# Patient Record
Sex: Female | Born: 1980 | Race: Black or African American | Hispanic: No | Marital: Single | Smoking: Never smoker
Health system: Southern US, Community
[De-identification: ages and names within clinical notes are randomized; demographics above are authoritative.]

## PROBLEM LIST (undated history)

## (undated) ENCOUNTER — Inpatient Hospital Stay (HOSPITAL_COMMUNITY): Payer: Self-pay

## (undated) DIAGNOSIS — R87629 Unspecified abnormal cytological findings in specimens from vagina: Secondary | ICD-10-CM

## (undated) DIAGNOSIS — E282 Polycystic ovarian syndrome: Secondary | ICD-10-CM

## (undated) DIAGNOSIS — K219 Gastro-esophageal reflux disease without esophagitis: Secondary | ICD-10-CM

## (undated) DIAGNOSIS — Z3490 Encounter for supervision of normal pregnancy, unspecified, unspecified trimester: Secondary | ICD-10-CM

## (undated) DIAGNOSIS — F909 Attention-deficit hyperactivity disorder, unspecified type: Secondary | ICD-10-CM

## (undated) HISTORY — PX: WISDOM TOOTH EXTRACTION: SHX21

---
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
## 2002-12-23 ENCOUNTER — Other Ambulatory Visit: Admission: RE | Admit: 2002-12-23 | Discharge: 2002-12-23 | Payer: Self-pay | Admitting: Gynecology

## 2003-10-28 ENCOUNTER — Other Ambulatory Visit: Admission: RE | Admit: 2003-10-28 | Discharge: 2003-10-28 | Payer: Self-pay | Admitting: Gynecology

## 2004-04-25 ENCOUNTER — Other Ambulatory Visit: Admission: RE | Admit: 2004-04-25 | Discharge: 2004-04-25 | Payer: Self-pay | Admitting: Gynecology

## 2006-01-18 ENCOUNTER — Other Ambulatory Visit: Admission: RE | Admit: 2006-01-18 | Discharge: 2006-01-18 | Payer: Self-pay | Admitting: Gynecology

## 2006-02-11 ENCOUNTER — Emergency Department (HOSPITAL_COMMUNITY): Admission: EM | Admit: 2006-02-11 | Discharge: 2006-02-11 | Payer: Self-pay | Admitting: Emergency Medicine

## 2006-02-15 ENCOUNTER — Emergency Department (HOSPITAL_COMMUNITY): Admission: EM | Admit: 2006-02-15 | Discharge: 2006-02-15 | Payer: Self-pay | Admitting: Family Medicine

## 2006-03-25 ENCOUNTER — Emergency Department (HOSPITAL_COMMUNITY): Admission: EM | Admit: 2006-03-25 | Discharge: 2006-03-25 | Payer: Self-pay | Admitting: Emergency Medicine

## 2006-03-26 ENCOUNTER — Emergency Department (HOSPITAL_COMMUNITY): Admission: EM | Admit: 2006-03-26 | Discharge: 2006-03-26 | Payer: Self-pay | Admitting: Family Medicine

## 2006-04-02 ENCOUNTER — Emergency Department (HOSPITAL_COMMUNITY): Admission: EM | Admit: 2006-04-02 | Discharge: 2006-04-02 | Payer: Self-pay | Admitting: Family Medicine

## 2006-06-24 ENCOUNTER — Emergency Department (HOSPITAL_COMMUNITY): Admission: EM | Admit: 2006-06-24 | Discharge: 2006-06-24 | Payer: Self-pay | Admitting: Emergency Medicine

## 2007-02-08 ENCOUNTER — Emergency Department (HOSPITAL_COMMUNITY): Admission: EM | Admit: 2007-02-08 | Discharge: 2007-02-08 | Payer: Self-pay | Admitting: Family Medicine

## 2007-09-24 ENCOUNTER — Inpatient Hospital Stay (HOSPITAL_COMMUNITY): Admission: AD | Admit: 2007-09-24 | Discharge: 2007-09-27 | Payer: Self-pay | Admitting: Obstetrics & Gynecology

## 2007-12-20 ENCOUNTER — Emergency Department (HOSPITAL_COMMUNITY): Admission: EM | Admit: 2007-12-20 | Discharge: 2007-12-20 | Payer: Self-pay | Admitting: Family Medicine

## 2007-12-21 ENCOUNTER — Emergency Department (HOSPITAL_COMMUNITY): Admission: EM | Admit: 2007-12-21 | Discharge: 2007-12-21 | Payer: Self-pay | Admitting: Emergency Medicine

## 2009-02-18 ENCOUNTER — Emergency Department (HOSPITAL_COMMUNITY): Admission: EM | Admit: 2009-02-18 | Discharge: 2009-02-18 | Payer: Self-pay | Admitting: Family Medicine

## 2010-07-01 NOTE — Discharge Summary (Signed)
NAMEDINESHA, Sierra Jackson              ACCOUNT NO.:  [id number]   MEDICAL RECORD NO.:  [id number]          PATIENT TYPE:  INP   LOCATION:  9148                          FACILITY:  [facility]   PHYSICIAN:  Malva Limes, M.D.    DATE OF BIRTH:  13-Feb-1981   DATE OF ADMISSION:  09/24/2007  DATE OF DISCHARGE:  09/27/2007                               DISCHARGE SUMMARY   FINAL DIAGNOSES:  1. Intrauterine pregnancy at term.  2. Active labor.  3. Spontaneous vaginal delivery of a female infant with Apgars of 7 and      8.   PROCEDURE:  Spontaneous vaginal delivery performed by Dr. Annamaria Helling.   COMPLICATIONS:  None.   This 30 year old G1, P0 presents at term in early labor.  The patient's  antepartum course up to this point had been complicated by history of  ADD, which the patient was not on any medications for during this  pregnancy.  The patient also did have a negative group B strep culture  obtained in our office at 36th week.  The patient dilated to complete  and completed, had spontaneous vaginal delivery of a 8 pounds 4 ounces  female infant with Apgars of 7 and 8 over a female of first-degree tear.  The delivery went without complications.  The patient's postpartum  course was benign without any significant fevers.  The patient did want  her little boy circumcised prior to discharge.  The patient was felt  ready for discharge.  On postpartum day #2, she was sent home on a  regular diet, told to decrease activities, told to continue her vitamins  and was given Percocet 1-2 every 4-6 hours as needed for pain.  I told  she could use over-the-counter Motrin up to 600 mg every 6 hours as  needed for pain and was to follow up in our office in 4 weeks.   INSTRUCTIONS:  Precautions were reviewed with the patient.   LABORATORIES ON DISCHARGE:  The patient had a hemoglobin of 13.1, white  blood cell count of 13.3, and platelets of 318,000.       Leilani Able, P.A.-C.      ______________________________  Malva Limes, M.D.    MB/MEDQ  D:  10/21/2007  T:  10/21/2007  Job:  [id number]

## 2010-07-17 ENCOUNTER — Inpatient Hospital Stay (HOSPITAL_COMMUNITY)
Admission: AD | Admit: 2010-07-17 | Discharge: 2010-07-20 | DRG: 775 | Disposition: A | Discharge status: Home / Self Care | Payer: Medicaid Other | Source: Ambulatory Visit | Attending: Obstetrics and Gynecology | Admitting: Obstetrics and Gynecology

## 2010-07-17 LAB — CBC
HCT: 37.5 % (ref 36.0–46.0)
MCH: 28.5 pg (ref 26.0–34.0)
MCHC: 34.4 g/dL (ref 30.0–36.0)
MCV: 83 fL (ref 78.0–100.0)
Platelets: 280 10*3/uL (ref 150–400)
RDW: 15.8 % — ABNORMAL HIGH (ref 11.5–15.5)

## 2010-07-19 LAB — CBC
MCH: 28.3 pg (ref 26.0–34.0)
MCHC: 32.9 g/dL (ref 30.0–36.0)
MCV: 86.2 fL (ref 78.0–100.0)
Platelets: 234 10*3/uL (ref 150–400)
RBC: 4.13 MIL/uL (ref 3.87–5.11)
RDW: 15.5 % (ref 11.5–15.5)

## 2010-11-11 LAB — CBC
HCT: 41
MCHC: 33.2
Platelets: 328
RBC: 4.38
RDW: 13.9
RDW: 14.8
WBC: 10.9 — ABNORMAL HIGH

## 2010-11-11 LAB — RPR: RPR Ser Ql: NONREACTIVE

## 2010-11-15 LAB — POCT RAPID STREP A: Streptococcus, Group A Screen (Direct): NEGATIVE

## 2010-11-18 LAB — POCT URINALYSIS DIP (DEVICE)
Glucose, UA: NEGATIVE
Hgb urine dipstick: NEGATIVE
Nitrite: NEGATIVE
Urobilinogen, UA: 0.2
pH: 5.5

## 2010-11-18 LAB — POCT PREGNANCY, URINE
Operator id: 239701
Preg Test, Ur: POSITIVE

## 2012-09-12 ENCOUNTER — Emergency Department (HOSPITAL_COMMUNITY)
Admission: EM | Admit: 2012-09-12 | Discharge: 2012-09-12 | Disposition: A | Discharge status: Home / Self Care | Payer: Medicaid Other | Attending: Emergency Medicine | Admitting: Emergency Medicine

## 2012-09-12 ENCOUNTER — Encounter (HOSPITAL_COMMUNITY): Payer: Self-pay | Admitting: Adult Health

## 2012-09-12 ENCOUNTER — Emergency Department (HOSPITAL_COMMUNITY): Payer: Medicaid Other

## 2012-09-12 DIAGNOSIS — R110 Nausea: Secondary | ICD-10-CM | POA: Insufficient documentation

## 2012-09-12 DIAGNOSIS — R51 Headache: Secondary | ICD-10-CM | POA: Insufficient documentation

## 2012-09-12 DIAGNOSIS — F909 Attention-deficit hyperactivity disorder, unspecified type: Secondary | ICD-10-CM | POA: Insufficient documentation

## 2012-09-12 DIAGNOSIS — R059 Cough, unspecified: Secondary | ICD-10-CM | POA: Insufficient documentation

## 2012-09-12 DIAGNOSIS — L539 Erythematous condition, unspecified: Secondary | ICD-10-CM | POA: Insufficient documentation

## 2012-09-12 DIAGNOSIS — R05 Cough: Secondary | ICD-10-CM | POA: Insufficient documentation

## 2012-09-12 DIAGNOSIS — J020 Streptococcal pharyngitis: Secondary | ICD-10-CM | POA: Insufficient documentation

## 2012-09-12 DIAGNOSIS — Z79899 Other long term (current) drug therapy: Secondary | ICD-10-CM | POA: Insufficient documentation

## 2012-09-12 DIAGNOSIS — Z8742 Personal history of other diseases of the female genital tract: Secondary | ICD-10-CM | POA: Insufficient documentation

## 2012-09-12 HISTORY — DX: Polycystic ovarian syndrome: E28.2

## 2012-09-12 HISTORY — DX: Attention-deficit hyperactivity disorder, unspecified type: F90.9

## 2012-09-12 LAB — BASIC METABOLIC PANEL WITH GFR
BUN: 8 mg/dL (ref 6–23)
CO2: 26 meq/L (ref 19–32)
Chloride: 100 meq/L (ref 96–112)
Creatinine, Ser: 1.11 mg/dL — ABNORMAL HIGH (ref 0.50–1.10)
Glucose, Bld: 100 mg/dL — ABNORMAL HIGH (ref 70–99)
Potassium: 3.6 meq/L (ref 3.5–5.1)

## 2012-09-12 LAB — BASIC METABOLIC PANEL
Calcium: 9.4 mg/dL (ref 8.4–10.5)
GFR calc Af Amer: 77 mL/min — ABNORMAL LOW (ref >90)
GFR calc non Af Amer: 66 mL/min — ABNORMAL LOW (ref >90)
Sodium: 137 mEq/L (ref 135–145)

## 2012-09-12 LAB — CBC WITH DIFFERENTIAL/PLATELET
Basophils Absolute: 0 10*3/uL (ref 0.0–0.1)
Basophils Relative: 0 % (ref 0–1)
Eosinophils Absolute: 0 10*3/uL (ref 0.0–0.7)
Eosinophils Relative: 0 % (ref 0–5)
HCT: 40.3 % (ref 36.0–46.0)
Hemoglobin: 14.2 g/dL (ref 12.0–15.0)
Lymphocytes Relative: 18 % (ref 12–46)
Lymphs Abs: 2 K/uL (ref 0.7–4.0)
MCH: 29.2 pg (ref 26.0–34.0)
MCHC: 35.2 g/dL (ref 30.0–36.0)
MCV: 82.8 fL (ref 78.0–100.0)
Monocytes Absolute: 0.8 K/uL (ref 0.1–1.0)
Monocytes Relative: 8 % (ref 3–12)
Neutro Abs: 8.1 K/uL — ABNORMAL HIGH (ref 1.7–7.7)
Neutrophils Relative %: 74 % (ref 43–77)
Platelets: 329 10*3/uL (ref 150–400)
RBC: 4.87 MIL/uL (ref 3.87–5.11)
RDW: 14 % (ref 11.5–15.5)
WBC: 10.9 K/uL — ABNORMAL HIGH (ref 4.0–10.5)

## 2012-09-12 LAB — RAPID STREP SCREEN (MED CTR MEBANE ONLY): Streptococcus, Group A Screen (Direct): NEGATIVE

## 2012-09-12 MED ORDER — ONDANSETRON 4 MG PO TBDP: 4.0000 mg | ORAL_TABLET | Freq: Three times a day (TID) | ORAL | Status: DC | PRN

## 2012-09-12 MED ORDER — ACETAMINOPHEN 325 MG PO TABS
650.0000 mg | ORAL_TABLET | Freq: Once | ORAL | Status: AC
  Administered 2012-09-12: 650 mg via ORAL
  Filled 2012-09-12: qty 2

## 2012-09-12 MED ORDER — PENICILLIN G BENZATHINE 1200000 UNIT/2ML IM SUSP
1.2000 10*6.[IU] | Freq: Once | INTRAMUSCULAR | Status: AC
  Administered 2012-09-12: 1.2 10*6.[IU] via INTRAMUSCULAR
  Filled 2012-09-12: qty 2

## 2012-09-12 MED ORDER — DEXAMETHASONE SODIUM PHOSPHATE 10 MG/ML IJ SOLN
10.0000 mg | Freq: Once | INTRAMUSCULAR | Status: AC
  Administered 2012-09-12: 10 mg via INTRAMUSCULAR
  Filled 2012-09-12: qty 1

## 2012-09-12 NOTE — ED Provider Notes (Signed)
CSN: [id number]     Arrival date & time 09/12/12  1756 History     First MD Initiated Contact with Patient 09/12/12 2008     Chief Complaint  Patient presents with  . Fever   (Consider location/radiation/quality/duration/timing/severity/associated sxs/prior Treatment) The history is provided by the patient, medical records and a parent.   Patient presents to the ED for fever, sore throat, headache, and intermittent nausea x 4 days. States pain is worse with swallowing but no difficulty doing so.  States "it feels like there are ulcers on my tonsils and it is making me nauseated."  Decreased PO intake due to painful swallowing.  Patient states she's also had an intermittent, non-productive cough, and mild pain with deep inspiration. No chest pain or SOB.  No recent known sick contacts. No hx of asthma.  Patient was seen in urgent care earlier today and had a rapid strep and mono tests performed, both of which were negative.  Past Medical History  Diagnosis Date  . PCOS (polycystic ovarian syndrome)   . ADHD (attention deficit hyperactivity disorder)    History reviewed. No pertinent past surgical history. History reviewed. No pertinent family history. History  Substance Use Topics  . Smoking status: Never Smoker   . Smokeless tobacco: Not on file  . Alcohol Use: No   OB History   Grav Para Term Preterm Abortions TAB SAB Ect Mult Living                 Review of Systems  Constitutional: Positive for fever.  HENT: Positive for sore throat.   Respiratory: Positive for cough.   Gastrointestinal: Positive for nausea.  Neurological: Positive for headaches.  All other systems reviewed and are negative.    Allergies  Review of patient's allergies indicates no known allergies.  Home Medications   Current Outpatient Rx  Name  Route  Sig  Dispense  Refill  . acetaminophen (TYLENOL) 500 MG tablet   Oral   Take 1,000 mg by mouth every 6 (six) hours as needed for pain.          Marland Kitchen amoxicillin-clavulanate (AUGMENTIN) 875-125 MG per tablet   Oral   Take 1 tablet by mouth 2 (two) times daily. For 14 days         . amphetamine-dextroamphetamine (ADDERALL) 20 MG tablet   Oral   Take 40 mg by mouth daily.         . norethindrone (MICRONOR,CAMILA,ERRIN) 0.35 MG tablet   Oral   Take 1 tablet by mouth daily.          BP 104/64  Pulse 104  Temp(Src) 102.8 F (39.3 C) (Oral)  Resp 16  Wt 222 lb (100.699 kg)  SpO2 100%  LMP 08/22/2012  Physical Exam  Nursing note and vitals reviewed. Constitutional: She is oriented to person, place, and time. She appears well-developed and well-nourished. No distress.  HENT:  Head: Normocephalic and atraumatic. No trismus in the jaw.  Right Ear: Tympanic membrane and ear canal normal.  Left Ear: Tympanic membrane and ear canal normal.  Nose: Nose normal.  Mouth/Throat: Uvula is midline and mucous membranes are normal. No oral lesions. No edematous. Posterior oropharyngeal erythema present. No oropharyngeal exudate, posterior oropharyngeal edema or tonsillar abscesses.  Oropharynx erythematous, tonsils 2+ bilaterally with exudates, no oropharyngeal edema, airway patent, uvula midline, no PTA, handling secretions appropriately, no difficulty swallowing  Eyes: Conjunctivae and EOM are normal. Pupils are equal, round, and reactive to light.  Neck: Normal  range of motion. Neck supple.  Cardiovascular: Normal rate, regular rhythm and normal heart sounds.   Pulmonary/Chest: Effort normal and breath sounds normal. Not tachypneic. No respiratory distress. She has no decreased breath sounds. She has no wheezes. She has no rhonchi.  Lungs CTAB  Abdominal: Soft. Bowel sounds are normal. There is no tenderness. There is no guarding.  Musculoskeletal: Normal range of motion.  Neurological: She is alert and oriented to person, place, and time.  Skin: Skin is warm and dry. She is not diaphoretic.  Psychiatric: She has a normal mood and  affect.    ED Course   Procedures (including critical care time)  Labs Reviewed  CBC WITH DIFFERENTIAL - Abnormal; Notable for the following:    WBC 10.9 (*)    Neutro Abs 8.1 (*)    All other components within normal limits  BASIC METABOLIC PANEL - Abnormal; Notable for the following:    Glucose, Bld 100 (*)    Creatinine, Ser 1.11 (*)    GFR calc non Af Amer 66 (*)    GFR calc Af Amer 77 (*)    All other components within normal limits  RAPID STREP SCREEN  CULTURE, GROUP A STREP   Dg Chest 2 View  09/12/2012   *RADIOLOGY REPORT*  Clinical Data: Fever, left-sided chest pain  CHEST - 2 VIEW  Comparison: April 02, 2006  Findings: There is no focal infiltrate, pulmonary edema, or pleural effusion.  The mediastinal contour and cardiac silhouette are normal.  The soft tissues and osseous structures are normal.  IMPRESSION: No acute cardiopulmonary disease identified.   Original Report Authenticated By: Sherian Rein, M.D.   1. Strep pharyngitis     MDM   Labs as above.  CXR clear.  Rapid strep negative, however Signs/sx consistent with strep.  Tx with decadron and bicillin in the ED.  Tylenol given for fever.  Pt tolerating PO without difficulty, airway patent, pt states she feels better after meds given.  Rx zofran for intermittent nausea.  FU with [facility] if sx not improving in the next few days.  Discussed plan with pt and mom, they agreed.  Return precautions advised.  Garlon Hatchet, PA-C 09/12/12 2258

## 2012-09-12 NOTE — ED Notes (Signed)
Presents with fever of 100.8, chills, sore throat, nausea, headache since Monday. She reports that she had a mono and strep test at an [facility] and it came back negative. Pt staes she is feeling worse and not better. Throat red and tonsils enlarged.  Reports cough and pain with deep inspiration.

## 2012-09-13 NOTE — ED Provider Notes (Signed)
Medical screening examination/treatment/procedure(s) were performed by non-physician practitioner and as supervising physician I was immediately available for consultation/collaboration.   Pressley Tadesse Y. Torra Pala, MD 09/13/12 1454 

## 2012-09-14 LAB — CULTURE, GROUP A STREP

## 2012-11-12 DIAGNOSIS — Z862 Personal history of diseases of the blood and blood-forming organs and certain disorders involving the immune mechanism: Secondary | ICD-10-CM | POA: Insufficient documentation

## 2012-11-12 DIAGNOSIS — Z79899 Other long term (current) drug therapy: Secondary | ICD-10-CM | POA: Insufficient documentation

## 2012-11-12 DIAGNOSIS — K089 Disorder of teeth and supporting structures, unspecified: Secondary | ICD-10-CM | POA: Insufficient documentation

## 2012-11-12 DIAGNOSIS — F909 Attention-deficit hyperactivity disorder, unspecified type: Secondary | ICD-10-CM | POA: Insufficient documentation

## 2012-11-12 DIAGNOSIS — Z8639 Personal history of other endocrine, nutritional and metabolic disease: Secondary | ICD-10-CM | POA: Insufficient documentation

## 2012-11-12 DIAGNOSIS — H9209 Otalgia, unspecified ear: Secondary | ICD-10-CM | POA: Insufficient documentation

## 2012-11-12 NOTE — ED Notes (Signed)
Presents with left sided dental pain with radiation into left ear, pain began Friday., no dental caries noted.

## 2012-11-13 ENCOUNTER — Emergency Department (HOSPITAL_COMMUNITY)
Admission: EM | Admit: 2012-11-13 | Discharge: 2012-11-13 | Disposition: A | Discharge status: Home / Self Care | Payer: Medicaid Other | Attending: Emergency Medicine | Admitting: Emergency Medicine

## 2012-11-13 ENCOUNTER — Encounter (HOSPITAL_COMMUNITY): Payer: Self-pay | Admitting: Adult Health

## 2012-11-13 DIAGNOSIS — K0889 Other specified disorders of teeth and supporting structures: Secondary | ICD-10-CM

## 2012-11-13 MED ORDER — PENICILLIN V POTASSIUM 250 MG PO TABS
500.0000 mg | ORAL_TABLET | Freq: Once | ORAL | Status: AC
  Administered 2012-11-13: 500 mg via ORAL
  Filled 2012-11-13: qty 2

## 2012-11-13 MED ORDER — TRAMADOL HCL 50 MG PO TABS
50.0000 mg | ORAL_TABLET | Freq: Once | ORAL | Status: AC
  Administered 2012-11-13: 50 mg via ORAL
  Filled 2012-11-13: qty 1

## 2012-11-13 MED ORDER — TRAMADOL HCL 50 MG PO TABS: 50.0000 mg | ORAL_TABLET | Freq: Four times a day (QID) | ORAL | Status: DC | PRN

## 2012-11-13 MED ORDER — PENICILLIN V POTASSIUM 500 MG PO TABS: 500.0000 mg | ORAL_TABLET | Freq: Three times a day (TID) | ORAL | Status: DC

## 2012-11-13 NOTE — ED Provider Notes (Signed)
Medical screening examination/treatment/procedure(s) were performed by non-physician practitioner and as supervising physician I was immediately available for consultation/collaboration.  Flint Melter, MD 11/13/12 1039

## 2012-11-13 NOTE — ED Provider Notes (Signed)
CSN: [id number]     Arrival date & time 11/12/12  2358 History   First MD Initiated Contact with Patient 11/13/12 0136     Chief Complaint  Patient presents with  . Dental Pain   (Consider location/radiation/quality/duration/timing/severity/associated sxs/prior Treatment) HPI Comments: Patient with pain in the very back, right upper, and right lower for several, days .  Has been taking ibuprofen without relief  Patient is a 32 y.o. female presenting with tooth pain. The history is provided by the patient.  Dental Pain Location:  Upper and lower Upper teeth location:  1/RU 3rd molar Lower teeth location:  17/LL 3rd molar Quality:  Aching Severity:  Moderate Timing:  Constant Progression:  Worsening Chronicity:  New Associated symptoms: no fever and no headaches     Past Medical History  Diagnosis Date  . PCOS (polycystic ovarian syndrome)   . ADHD (attention deficit hyperactivity disorder)    History reviewed. No pertinent past surgical history. History reviewed. No pertinent family history. History  Substance Use Topics  . Smoking status: Never Smoker   . Smokeless tobacco: Not on file  . Alcohol Use: No   OB History   Grav Para Term Preterm Abortions TAB SAB Ect Mult Living                 Review of Systems  Constitutional: Negative for fever and chills.  HENT: Positive for ear pain and dental problem.   Neurological: Negative for dizziness and headaches.  All other systems reviewed and are negative.    Allergies  Review of patient's allergies indicates no known allergies.  Home Medications   Current Outpatient Rx  Name  Route  Sig  Dispense  Refill  . amphetamine-dextroamphetamine (ADDERALL) 20 MG tablet   Oral   Take 40 mg by mouth daily.         . norethindrone (MICRONOR,CAMILA,ERRIN) 0.35 MG tablet   Oral   Take 1 tablet by mouth daily.         . penicillin v potassium (VEETID) 500 MG tablet   Oral   Take 1 tablet (500 mg total) by mouth 3  (three) times daily.   30 tablet   0   . traMADol (ULTRAM) 50 MG tablet   Oral   Take 1 tablet (50 mg total) by mouth every 6 (six) hours as needed for pain.   30 tablet   0    BP 131/89  Pulse 102  Temp(Src) 98 F (36.7 C) (Oral)  Resp 18  SpO2 100% Physical Exam  Nursing note and vitals reviewed. Constitutional: She is oriented to person, place, and time. She appears well-developed and well-nourished.  HENT:  Head: Normocephalic.  Right Ear: External ear normal.  Left Ear: External ear normal.  Mouth/Throat:    Eyes: Pupils are equal, round, and reactive to light.  Neck: Normal range of motion.  Cardiovascular: Normal rate.   Pulmonary/Chest: Effort normal.  Lymphadenopathy:    She has no cervical adenopathy.  Neurological: She is alert and oriented to person, place, and time.  Skin: Skin is warm. No rash noted. No erythema.    ED Course  Procedures (including critical care time) Labs Review Labs Reviewed - No data to display Imaging Review No results found.  MDM   1. Pain, dental     Will refer patient to Dr. Barbette Merino oral surgery    Arman Filter, NP 11/13/12 4098

## 2012-11-13 NOTE — ED Notes (Signed)
PA has given ok to administer Pain med at discharge

## 2013-12-17 ENCOUNTER — Emergency Department (HOSPITAL_COMMUNITY)
Admission: EM | Admit: 2013-12-17 | Discharge: 2013-12-17 | Disposition: A | Discharge status: Home / Self Care | Payer: BC Managed Care – PPO | Source: Home / Self Care | Attending: Emergency Medicine | Admitting: Emergency Medicine

## 2013-12-17 ENCOUNTER — Encounter (HOSPITAL_COMMUNITY): Payer: Self-pay | Admitting: Emergency Medicine

## 2013-12-17 DIAGNOSIS — K088 Other specified disorders of teeth and supporting structures: Secondary | ICD-10-CM

## 2013-12-17 DIAGNOSIS — K0889 Other specified disorders of teeth and supporting structures: Secondary | ICD-10-CM

## 2013-12-17 MED ORDER — CEFTRIAXONE SODIUM 250 MG IJ SOLR
INTRAMUSCULAR | Status: AC
  Filled 2013-12-17: qty 250

## 2013-12-17 MED ORDER — PENICILLIN V POTASSIUM 500 MG PO TABS: 500.0000 mg | ORAL_TABLET | Freq: Four times a day (QID) | ORAL | Status: AC

## 2013-12-17 MED ORDER — KETOROLAC TROMETHAMINE 60 MG/2ML IM SOLN
60.0000 mg | Freq: Once | INTRAMUSCULAR | Status: AC
  Administered 2013-12-17: 60 mg via INTRAMUSCULAR

## 2013-12-17 MED ORDER — FLUCONAZOLE 150 MG PO TABS: 150.0000 mg | ORAL_TABLET | Freq: Every day | ORAL | Status: DC

## 2013-12-17 MED ORDER — HYDROCODONE-ACETAMINOPHEN 5-325 MG PO TABS: 1.0000 | ORAL_TABLET | Freq: Four times a day (QID) | ORAL | Status: DC | PRN

## 2013-12-17 MED ORDER — LIDOCAINE HCL (PF) 1 % IJ SOLN
INTRAMUSCULAR | Status: AC
  Filled 2013-12-17: qty 5

## 2013-12-17 MED ORDER — KETOROLAC TROMETHAMINE 60 MG/2ML IM SOLN
INTRAMUSCULAR | Status: AC
  Filled 2013-12-17: qty 2

## 2013-12-17 MED ORDER — CEFTRIAXONE SODIUM 250 MG IJ SOLR
250.0000 mg | Freq: Once | INTRAMUSCULAR | Status: AC
  Administered 2013-12-17: 250 mg via INTRAMUSCULAR

## 2013-12-17 NOTE — ED Provider Notes (Signed)
CSN: [id number]     Arrival date & time 12/17/13  1859 History   First MD Initiated Contact with Patient 12/17/13 1930     Chief Complaint  Patient presents with  . Dental Pain   (Consider location/radiation/quality/duration/timing/severity/associated sxs/prior Treatment) HPI  She is a 33 year old woman here for evaluation of dental pain. She states that she had sudden onset of pain in her left jaw. It involves both the upper and lower jaw, it goes up to her ear and into the left occiput. It is a little worse with opening and closing her jaw. She denies any acute injury or trauma. She does state she has a chipped tooth. No fevers or chills. No nausea or vomiting.  She does report a history of a dental infection that led to sepsis. She is quite anxious about this as this pain is similar.  Past Medical History  Diagnosis Date  . PCOS (polycystic ovarian syndrome)   . ADHD (attention deficit hyperactivity disorder)    History reviewed. No pertinent past surgical history. No family history on file. History  Substance Use Topics  . Smoking status: Never Smoker   . Smokeless tobacco: Not on file  . Alcohol Use: No   OB History    No data available     Review of Systems  Constitutional: Negative for fever and chills.  HENT: Positive for dental problem. Negative for drooling.   Gastrointestinal: Negative for nausea and vomiting.  Neurological: Negative for headaches.    Allergies  Review of patient's allergies indicates no known allergies.  Home Medications   Prior to Admission medications   Medication Sig Start Date End Date Taking? Authorizing Provider  amphetamine-dextroamphetamine (ADDERALL) 20 MG tablet Take 40 mg by mouth daily.   Yes Historical Provider, MD  HYDROcodone-acetaminophen (NORCO) 5-325 MG per tablet Take 1 tablet by mouth every 6 (six) hours as needed for moderate pain. 12/17/13   Charm RingsErin J Honig, MD  norethindrone (MICRONOR,CAMILA,ERRIN) 0.35 MG tablet Take 1  tablet by mouth daily.    Historical Provider, MD  penicillin v potassium (VEETID) 500 MG tablet Take 1 tablet (500 mg total) by mouth 4 (four) times daily. 12/17/13 12/24/13  Charm RingsErin J Honig, MD  traMADol (ULTRAM) 50 MG tablet Take 1 tablet (50 mg total) by mouth every 6 (six) hours as needed for pain. 11/13/12   Arman FilterGail K Schulz, NP   BP 121/78 mmHg  Pulse 89  Temp(Src) 97.8 F (36.6 C) (Oral)  Resp 16  SpO2 100%  LMP 11/05/2013 Physical Exam  Constitutional: She is oriented to person, place, and time. She appears well-developed and well-nourished. She appears distressed.  HENT:  Head: Normocephalic and atraumatic.  Mouth/Throat: Mucous membranes are not dry. Abnormal dentition. No dental abscesses.    No obvious dental abscess, but diffusely tender on the left upper and lower jaw.  Minimal tenderness over TMJ joint.  Neck: Neck supple.  Cardiovascular: Normal rate.   Pulmonary/Chest: Effort normal.  Lymphadenopathy:    She has no cervical adenopathy.  Neurological: She is alert and oriented to person, place, and time.    ED Course  Procedures (including critical care time) Labs Review Labs Reviewed - No data to display  Imaging Review No results found.   MDM   1. Pain, dental    Toradol 60 mg IM and Rocephin 250 mg IM given here.  We'll treat with a 10 day course of penicillin. Recommended ibuprofen for pain. Prescription for Norco provided for severe pain.  Reviewed  warning precautions as in after visit summary. Discussed that she'll need follow-up with a dentist.    Charm RingsErin J Honig, MD 12/17/13 2001

## 2013-12-17 NOTE — ED Notes (Signed)
Reports she developed severe dental pain onset 1750 Denies fevers, chills Alert, no signs of acute distress.

## 2013-12-17 NOTE — Discharge Instructions (Signed)
Take penicillin 1 pill 4 times a day starting tomorrow. Take ibuprofen 600mg  every 8 hours as needed for pain. Take Norco 1-2 tablets every 4-6 hours as needed for severe pain.  If your symptoms worse, you develop fevers or vomiting, or you are not improving by Friday, please go to the emergency room.

## 2014-09-03 ENCOUNTER — Emergency Department (INDEPENDENT_AMBULATORY_CARE_PROVIDER_SITE_OTHER): Payer: Self-pay

## 2014-09-03 ENCOUNTER — Emergency Department (INDEPENDENT_AMBULATORY_CARE_PROVIDER_SITE_OTHER)
Admission: EM | Admit: 2014-09-03 | Discharge: 2014-09-03 | Disposition: A | Discharge status: Home / Self Care | Payer: Self-pay | Source: Home / Self Care | Attending: Family Medicine | Admitting: Family Medicine

## 2014-09-03 ENCOUNTER — Encounter (HOSPITAL_COMMUNITY): Payer: Self-pay | Admitting: Emergency Medicine

## 2014-09-03 MED ORDER — CYCLOBENZAPRINE HCL 5 MG PO TABS: 5.0000 mg | ORAL_TABLET | Freq: Three times a day (TID) | ORAL | Status: DC | PRN

## 2014-09-03 MED ORDER — IBUPROFEN 800 MG PO TABS: 800.0000 mg | ORAL_TABLET | Freq: Three times a day (TID) | ORAL | Status: DC
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
## 2014-09-03 NOTE — Discharge Instructions (Signed)
Heat and medicine as needed, see your doctor if further problems. °

## 2014-11-13 LAB — OB RESULTS CONSOLE GC/CHLAMYDIA
CHLAMYDIA, DNA PROBE: NEGATIVE
GC PROBE AMP, GENITAL: NEGATIVE

## 2014-12-10 LAB — OB RESULTS CONSOLE HIV ANTIBODY (ROUTINE TESTING): HIV: NONREACTIVE

## 2014-12-10 LAB — OB RESULTS CONSOLE RPR: RPR: NONREACTIVE

## 2014-12-10 LAB — OB RESULTS CONSOLE ABO/RH: RH Type: POSITIVE

## 2014-12-10 LAB — OB RESULTS CONSOLE HEPATITIS B SURFACE ANTIGEN: Hepatitis B Surface Ag: NEGATIVE

## 2014-12-10 LAB — OB RESULTS CONSOLE RUBELLA ANTIBODY, IGM: RUBELLA: IMMUNE
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
## 2015-05-08 ENCOUNTER — Inpatient Hospital Stay (HOSPITAL_COMMUNITY)
Admission: AD | Admit: 2015-05-08 | Discharge: 2015-05-08 | Disposition: A | Discharge status: Home / Self Care | Payer: Medicaid Other | Source: Ambulatory Visit | Attending: Obstetrics | Admitting: Obstetrics

## 2015-05-08 ENCOUNTER — Encounter (HOSPITAL_COMMUNITY): Payer: Self-pay | Admitting: *Deleted

## 2015-05-08 DIAGNOSIS — O4693 Antepartum hemorrhage, unspecified, third trimester: Secondary | ICD-10-CM | POA: Insufficient documentation

## 2015-05-08 DIAGNOSIS — Z3A32 32 weeks gestation of pregnancy: Secondary | ICD-10-CM | POA: Diagnosis not present

## 2015-05-08 DIAGNOSIS — R319 Hematuria, unspecified: Secondary | ICD-10-CM | POA: Insufficient documentation

## 2015-05-08 LAB — URINALYSIS, ROUTINE W REFLEX MICROSCOPIC
Bilirubin Urine: NEGATIVE
Glucose, UA: NEGATIVE mg/dL
KETONES UR: NEGATIVE mg/dL
Nitrite: NEGATIVE
Protein, ur: 30 mg/dL — AB
Specific Gravity, Urine: 1.025 (ref 1.005–1.030)
pH: 6.5 (ref 5.0–8.0)

## 2015-05-08 LAB — URINE MICROSCOPIC-ADD ON

## 2015-05-08 NOTE — MAU Note (Signed)
Pt C/O bleeding that started this a.m.,  Like the beginning of a period with some clots.  Denies pain.  Denies placenta previa.

## 2015-05-08 NOTE — Discharge Instructions (Signed)
Drink at least 8 8-oz glasses of water every day. The urine culture is pending.

## 2015-05-08 NOTE — MAU Provider Note (Signed)
History     CSN: [id number]  Arrival date and time: 05/08/15 0801   First Provider Initiated Contact with Patient 05/08/15 [phone]      Chief Complaint  Patient presents with  . Vaginal Bleeding   HPI Ramsha A Gombos 35 y.o. 7239w6d Comes to MAU today as she saw bleeding when she wiped today and had blood in the toilet.  No bleeding on clothing when coming to the hospital.  No abdominal pain.  Baby is moving well.  No dysuria.  OB History    Gravida Para Term Preterm AB TAB SAB Ectopic Multiple Living   3 2 2       2       Past Medical History  Diagnosis Date  . PCOS (polycystic ovarian syndrome)   . ADHD (attention deficit hyperactivity disorder)     History reviewed. No pertinent past surgical history.  History reviewed. No pertinent family history.  Social History  Substance Use Topics  . Smoking status: Never Smoker   . Smokeless tobacco: None  . Alcohol Use: No    Allergies: No Known Allergies  Prescriptions prior to admission  Medication Sig Dispense Refill Last Dose  . Prenatal Vit-Fe Fumarate-FA (PRENATAL MULTIVITAMIN) TABS tablet Take 1 tablet by mouth daily at 12 noon.   05/07/2015 at Unknown time  . cyclobenzaprine (FLEXERIL) 5 MG tablet Take 1 tablet (5 mg total) by mouth 3 (three) times daily as needed for muscle spasms. (Patient not taking: Reported on 05/08/2015) 30 tablet 0   . fluconazole (DIFLUCAN) 150 MG tablet Take 1 tablet (150 mg total) by mouth daily. (Patient not taking: Reported on 05/08/2015) 1 tablet 0 Unknown at Unknown time  . HYDROcodone-acetaminophen (NORCO) 5-325 MG per tablet Take 1 tablet by mouth every 6 (six) hours as needed for moderate pain. (Patient not taking: Reported on 05/08/2015) 20 tablet 0 Unknown at Unknown time  . ibuprofen (ADVIL,MOTRIN) 800 MG tablet Take 1 tablet (800 mg total) by mouth 3 (three) times daily. (Patient not taking: Reported on 05/08/2015) 30 tablet 0   . traMADol (ULTRAM) 50 MG tablet Take 1 tablet (50 mg total)  by mouth every 6 (six) hours as needed for pain. (Patient not taking: Reported on 05/08/2015) 30 tablet 0 Unknown at Unknown time    Review of Systems  Constitutional: Negative for fever.  Gastrointestinal: Negative for nausea, vomiting, abdominal pain, diarrhea and constipation.  Genitourinary:       No vaginal discharge. Bleeding when wiping. No dysuria.    Physical Exam   Blood pressure 130/66, pulse 94, temperature 97.4 F (36.3 C), temperature source Oral, resp. rate 18, last menstrual period 08/13/2014.  Physical Exam  Nursing note and vitals reviewed. Constitutional: She is oriented to person, place, and time. She appears well-developed and well-nourished.  HENT:  Head: Normocephalic.  Eyes: EOM are normal.  Neck: Neck supple.  GI: Soft. There is no tenderness.  FHT shows 15x15 accels.  Reactive strip.  Baby moving well.  No contractions on the monitor strip and none felt by patient.  Genitourinary:  Speculum exam: Vagina - Small amount of creamy discharge, no odor Cervix - No contact bleeding, no blood seen in the vagina. Bimanual exam: Cervix closed Chaperone present for exam.  Musculoskeletal: Normal range of motion.  Neurological: She is alert and oriented to person, place, and time.  Skin: Skin is warm and dry.  Psychiatric: She has a normal mood and affect.    MAU Course  Procedures Results for orders  placed or performed during the hospital encounter of 05/08/15 (from the past 24 hour(s))  Urinalysis, Routine w reflex microscopic (not at [facility]     Status: Abnormal   Collection Time: 05/08/15  8:15 AM  Result Value Ref Range   Color, Urine YELLOW YELLOW   APPearance CLEAR CLEAR   Specific Gravity, Urine 1.025 1.005 - 1.030   pH 6.5 5.0 - 8.0   Glucose, UA NEGATIVE NEGATIVE mg/dL   Hgb urine dipstick LARGE (A) NEGATIVE   Bilirubin Urine NEGATIVE NEGATIVE   Ketones, ur NEGATIVE NEGATIVE mg/dL   Protein, ur 30 (A) NEGATIVE mg/dL   Nitrite NEGATIVE  NEGATIVE   Leukocytes, UA MODERATE (A) NEGATIVE  Urine microscopic-add on     Status: Abnormal   Collection Time: 05/08/15  8:15 AM  Result Value Ref Range   Squamous Epithelial / LPF 6-30 (A) NONE SEEN   WBC, UA 6-30 0 - 5 WBC/hpf   RBC / HPF 0-5 0 - 5 RBC/hpf   Bacteria, UA MANY (A) NONE SEEN    MDM Consult with Dr. Chestine Spore by phone.  Will do urine culture.  Advise patient to be alert for symptoms of kidney stone due to blood in urine.  Assessment and Plan  No evidence of placental bleeding and all clinical indications show baby is doing well. 32w 6d and stable  Plan Keep appointment in the office. Call your doctor if you have worsening symptoms. Drink at least 8 8-oz glasses of water every day. Urine culture pending.  Luevenia Mcavoy 05/08/2015, 9:18 AM

## 2015-05-26 LAB — OB RESULTS CONSOLE GBS: GBS: NEGATIVE

## 2015-06-29 ENCOUNTER — Inpatient Hospital Stay (HOSPITAL_COMMUNITY): Payer: Medicaid Other | Admitting: Anesthesiology

## 2015-06-29 ENCOUNTER — Inpatient Hospital Stay (HOSPITAL_COMMUNITY)
Admission: AD | Admit: 2015-06-29 | Discharge: 2015-07-01 | DRG: 775 | Disposition: A | Discharge status: Home / Self Care | Payer: Medicaid Other | Source: Ambulatory Visit | Attending: Obstetrics and Gynecology | Admitting: Obstetrics and Gynecology

## 2015-06-29 ENCOUNTER — Encounter (HOSPITAL_COMMUNITY): Payer: Self-pay | Admitting: *Deleted

## 2015-06-29 DIAGNOSIS — O9962 Diseases of the digestive system complicating childbirth: Secondary | ICD-10-CM | POA: Diagnosis present

## 2015-06-29 DIAGNOSIS — K219 Gastro-esophageal reflux disease without esophagitis: Secondary | ICD-10-CM | POA: Diagnosis present

## 2015-06-29 DIAGNOSIS — E282 Polycystic ovarian syndrome: Secondary | ICD-10-CM | POA: Diagnosis present

## 2015-06-29 DIAGNOSIS — Z6841 Body Mass Index (BMI) 40.0 and over, adult: Secondary | ICD-10-CM | POA: Diagnosis not present

## 2015-06-29 DIAGNOSIS — O99344 Other mental disorders complicating childbirth: Secondary | ICD-10-CM | POA: Diagnosis present

## 2015-06-29 DIAGNOSIS — F909 Attention-deficit hyperactivity disorder, unspecified type: Secondary | ICD-10-CM | POA: Diagnosis present

## 2015-06-29 DIAGNOSIS — O99214 Obesity complicating childbirth: Secondary | ICD-10-CM | POA: Diagnosis present

## 2015-06-29 DIAGNOSIS — Z3A40 40 weeks gestation of pregnancy: Secondary | ICD-10-CM

## 2015-06-29 DIAGNOSIS — Z3490 Encounter for supervision of normal pregnancy, unspecified, unspecified trimester: Secondary | ICD-10-CM

## 2015-06-29 HISTORY — DX: Gastro-esophageal reflux disease without esophagitis: K21.9

## 2015-06-29 HISTORY — DX: Unspecified abnormal cytological findings in specimens from vagina: R87.629

## 2015-06-29 LAB — TYPE AND SCREEN
ABO/RH(D): A POS
Antibody Screen: NEGATIVE

## 2015-06-29 LAB — CBC
HEMATOCRIT: 38.5 % (ref 36.0–46.0)
Hemoglobin: 13.3 g/dL (ref 12.0–15.0)
MCH: 29.4 pg (ref 26.0–34.0)
MCHC: 34.5 g/dL (ref 30.0–36.0)
MCV: 85 fL (ref 78.0–100.0)
Platelets: 313 10*3/uL (ref 150–400)
RBC: 4.53 MIL/uL (ref 3.87–5.11)
RDW: 15.4 % (ref 11.5–15.5)
WBC: 9.8 10*3/uL (ref 4.0–10.5)

## 2015-06-29 LAB — ABO/RH: ABO/RH(D): A POS

## 2015-06-29 MED ORDER — BENZOCAINE-MENTHOL 20-0.5 % EX AERO
1.0000 "application " | INHALATION_SPRAY | CUTANEOUS | Status: DC | PRN
  Administered 2015-06-30: 1 via TOPICAL
  Filled 2015-06-29: qty 56

## 2015-06-29 MED ORDER — FERROUS SULFATE 325 (65 FE) MG PO TABS
325.0000 mg | ORAL_TABLET | Freq: Two times a day (BID) | ORAL | Status: DC
  Administered 2015-06-30 – 2015-07-01 (×3): 325 mg via ORAL
  Filled 2015-06-29 (×4): qty 1

## 2015-06-29 MED ORDER — LACTATED RINGERS IV SOLN
500.0000 mL | Freq: Once | INTRAVENOUS | Status: AC
  Administered 2015-06-29: 1000 mL via INTRAVENOUS

## 2015-06-29 MED ORDER — IBUPROFEN 800 MG PO TABS
800.0000 mg | ORAL_TABLET | Freq: Three times a day (TID) | ORAL | Status: DC
  Administered 2015-06-29 – 2015-07-01 (×6): 800 mg via ORAL
  Filled 2015-06-29 (×6): qty 1

## 2015-06-29 MED ORDER — ACETAMINOPHEN 325 MG PO TABS: 650.0000 mg | ORAL_TABLET | ORAL | Status: DC | PRN

## 2015-06-29 MED ORDER — METHYLERGONOVINE MALEATE 0.2 MG/ML IJ SOLN: 0.2000 mg | INTRAMUSCULAR | Status: DC | PRN

## 2015-06-29 MED ORDER — OXYCODONE-ACETAMINOPHEN 5-325 MG PO TABS: 1.0000 | ORAL_TABLET | ORAL | Status: DC | PRN

## 2015-06-29 MED ORDER — LIDOCAINE HCL (PF) 1 % IJ SOLN: 30.0000 mL | INTRAMUSCULAR | Status: DC | PRN

## 2015-06-29 MED ORDER — OXYTOCIN 40 UNITS IN LACTATED RINGERS INFUSION - SIMPLE MED
1.0000 m[IU]/min | INTRAVENOUS | Status: DC
  Administered 2015-06-29: 2 m[IU]/min via INTRAVENOUS
  Filled 2015-06-29: qty 1000

## 2015-06-29 MED ORDER — EPHEDRINE 5 MG/ML INJ: 10.0000 mg | INTRAVENOUS | Status: DC | PRN

## 2015-06-29 MED ORDER — CITRIC ACID-SODIUM CITRATE 334-500 MG/5ML PO SOLN: 30.0000 mL | ORAL | Status: DC | PRN

## 2015-06-29 MED ORDER — OXYCODONE-ACETAMINOPHEN 5-325 MG PO TABS: 2.0000 | ORAL_TABLET | ORAL | Status: DC | PRN

## 2015-06-29 MED ORDER — ONDANSETRON HCL 4 MG/2ML IJ SOLN: 4.0000 mg | INTRAMUSCULAR | Status: DC | PRN

## 2015-06-29 MED ORDER — ONDANSETRON HCL 4 MG/2ML IJ SOLN: 4.0000 mg | Freq: Four times a day (QID) | INTRAMUSCULAR | Status: DC | PRN

## 2015-06-29 MED ORDER — OXYTOCIN BOLUS FROM INFUSION
500.0000 mL | INTRAVENOUS | Status: DC
  Administered 2015-06-29: 500 mL via INTRAVENOUS

## 2015-06-29 MED ORDER — PHENYLEPHRINE 40 MCG/ML (10ML) SYRINGE FOR IV PUSH (FOR BLOOD PRESSURE SUPPORT)
80.0000 ug | PREFILLED_SYRINGE | INTRAVENOUS | Status: DC | PRN
Start: 2015-06-29 — End: 2015-06-29

## 2015-06-29 MED ORDER — FLEET ENEMA 7-19 GM/118ML RE ENEM: 1.0000 | ENEMA | Freq: Every day | RECTAL | Status: DC | PRN

## 2015-06-29 MED ORDER — OXYTOCIN 40 UNITS IN LACTATED RINGERS INFUSION - SIMPLE MED
2.5000 [IU]/h | INTRAVENOUS | Status: DC
  Administered 2015-06-29: 2.5 [IU]/h via INTRAVENOUS

## 2015-06-29 MED ORDER — DIPHENHYDRAMINE HCL 25 MG PO CAPS: 25.0000 mg | ORAL_CAPSULE | Freq: Four times a day (QID) | ORAL | Status: DC | PRN

## 2015-06-29 MED ORDER — MAGNESIUM HYDROXIDE 400 MG/5ML PO SUSP: 30.0000 mL | ORAL | Status: DC | PRN

## 2015-06-29 MED ORDER — DIBUCAINE 1 % RE OINT: 1.0000 "application " | TOPICAL_OINTMENT | RECTAL | Status: DC | PRN

## 2015-06-29 MED ORDER — PHENYLEPHRINE 40 MCG/ML (10ML) SYRINGE FOR IV PUSH (FOR BLOOD PRESSURE SUPPORT): 80.0000 ug | PREFILLED_SYRINGE | INTRAVENOUS | Status: DC | PRN

## 2015-06-29 MED ORDER — PHENYLEPHRINE 40 MCG/ML (10ML) SYRINGE FOR IV PUSH (FOR BLOOD PRESSURE SUPPORT)
80.0000 ug | PREFILLED_SYRINGE | INTRAVENOUS | Status: DC | PRN
  Filled 2015-06-29: qty 10

## 2015-06-29 MED ORDER — DIPHENHYDRAMINE HCL 50 MG/ML IJ SOLN: 12.5000 mg | INTRAMUSCULAR | Status: DC | PRN

## 2015-06-29 MED ORDER — SODIUM CHLORIDE 0.9% FLUSH: 3.0000 mL | Freq: Two times a day (BID) | INTRAVENOUS | Status: DC

## 2015-06-29 MED ORDER — LACTATED RINGERS IV SOLN: 500.0000 mL | Freq: Once | INTRAVENOUS | Status: DC

## 2015-06-29 MED ORDER — TETANUS-DIPHTH-ACELL PERTUSSIS 5-2.5-18.5 LF-MCG/0.5 IM SUSP: 0.5000 mL | Freq: Once | INTRAMUSCULAR | Status: DC

## 2015-06-29 MED ORDER — MEASLES, MUMPS & RUBELLA VAC ~~LOC~~ INJ: 0.5000 mL | INJECTION | Freq: Once | SUBCUTANEOUS | Status: DC

## 2015-06-29 MED ORDER — SODIUM CHLORIDE 0.9% FLUSH: 3.0000 mL | INTRAVENOUS | Status: DC | PRN

## 2015-06-29 MED ORDER — ZOLPIDEM TARTRATE 5 MG PO TABS
5.0000 mg | ORAL_TABLET | Freq: Every evening | ORAL | Status: DC | PRN
Start: 2015-06-29 — End: 2015-07-01

## 2015-06-29 MED ORDER — LACTATED RINGERS IV SOLN: 500.0000 mL | INTRAVENOUS | Status: DC | PRN

## 2015-06-29 MED ORDER — SENNOSIDES-DOCUSATE SODIUM 8.6-50 MG PO TABS
2.0000 | ORAL_TABLET | ORAL | Status: DC
  Administered 2015-06-30 (×2): 2 via ORAL
  Filled 2015-06-29 (×2): qty 2

## 2015-06-29 MED ORDER — SIMETHICONE 80 MG PO CHEW: 80.0000 mg | CHEWABLE_TABLET | ORAL | Status: DC | PRN

## 2015-06-29 MED ORDER — PRENATAL MULTIVITAMIN CH
1.0000 | ORAL_TABLET | Freq: Every day | ORAL | Status: DC
Start: 2015-06-30 — End: 2015-07-01
  Administered 2015-06-30 – 2015-07-01 (×2): 1 via ORAL
  Filled 2015-06-29 (×2): qty 1

## 2015-06-29 MED ORDER — ONDANSETRON HCL 4 MG PO TABS: 4.0000 mg | ORAL_TABLET | ORAL | Status: DC | PRN

## 2015-06-29 MED ORDER — COCONUT OIL OIL
1.0000 "application " | TOPICAL_OIL | Status: DC | PRN
  Filled 2015-06-29: qty 120

## 2015-06-29 MED ORDER — METHYLERGONOVINE MALEATE 0.2 MG PO TABS: 0.2000 mg | ORAL_TABLET | ORAL | Status: DC | PRN

## 2015-06-29 MED ORDER — TERBUTALINE SULFATE 1 MG/ML IJ SOLN: 0.2500 mg | Freq: Once | INTRAMUSCULAR | Status: DC | PRN

## 2015-06-29 MED ORDER — LACTATED RINGERS IV SOLN
INTRAVENOUS | Status: DC
  Administered 2015-06-29 (×2): via INTRAVENOUS

## 2015-06-29 MED ORDER — FENTANYL 2.5 MCG/ML BUPIVACAINE 1/10 % EPIDURAL INFUSION (WH - ANES)
14.0000 mL/h | INTRAMUSCULAR | Status: DC | PRN
  Administered 2015-06-29 (×2): 14 mL/h via EPIDURAL
  Filled 2015-06-29: qty 125

## 2015-06-29 MED ORDER — LIDOCAINE HCL (PF) 1 % IJ SOLN
INTRAMUSCULAR | Status: DC | PRN
  Administered 2015-06-29: 5 mL
  Administered 2015-06-29: 3 mL
  Administered 2015-06-29: 2 mL

## 2015-06-29 MED ORDER — SODIUM CHLORIDE 0.9 % IV SOLN: 250.0000 mL | INTRAVENOUS | Status: DC | PRN

## 2015-06-29 MED ORDER — WITCH HAZEL-GLYCERIN EX PADS: 1.0000 "application " | MEDICATED_PAD | CUTANEOUS | Status: DC | PRN

## 2015-06-29 NOTE — Anesthesia Preprocedure Evaluation (Signed)
Anesthesia Evaluation  Patient identified by MRN, date of birth, ID band Patient awake    Reviewed: Allergy & Precautions, Patient's Chart, lab work & pertinent test results  Airway Mallampati: III       Dental   Pulmonary neg pulmonary ROS,    Pulmonary exam normal        Cardiovascular negative cardio ROS Normal cardiovascular exam     Neuro/Psych negative neurological ROS     GI/Hepatic Neg liver ROS, GERD  ,  Endo/Other  Morbid obesity  Renal/GU negative Renal ROS     Musculoskeletal   Abdominal   Peds  Hematology negative hematology ROS (+)   Anesthesia Other Findings   Reproductive/Obstetrics (+) Pregnancy                             Lab Results  Component Value Date   WBC 9.8 06/29/2015   HGB 13.3 06/29/2015   HCT 38.5 06/29/2015   MCV 85.0 06/29/2015   PLT 313 06/29/2015    Anesthesia Physical Anesthesia Plan  ASA: III  Anesthesia Plan: Epidural   Post-op Pain Management:    Induction:   Airway Management Planned: Natural Airway  Additional Equipment:   Intra-op Plan:   Post-operative Plan:   Informed Consent: I have reviewed the patients History and Physical, chart, labs and discussed the procedure including the risks, benefits and alternatives for the proposed anesthesia with the patient or authorized representative who has indicated his/her understanding and acceptance.     Plan Discussed with:   Anesthesia Plan Comments:         Anesthesia Quick Evaluation

## 2015-06-29 NOTE — Anesthesia Procedure Notes (Signed)
Epidural Patient location during procedure: OB  Staffing Anesthesiologist: Marcene DuosFITZGERALD, Sierra Jackson Performed by: anesthesiologist   Preanesthetic Checklist Completed: patient identified, site marked, surgical consent, pre-op evaluation, timeout performed, IV checked, risks and benefits discussed and monitors and equipment checked  Epidural Patient position: sitting Prep: site prepped and draped and DuraPrep Patient monitoring: continuous pulse ox and blood pressure Approach: midline Location: L4-L5 Injection technique: LOR saline  Needle:  Needle type: Tuohy  Needle gauge: 17 G Needle length: 9 cm and 9 Needle insertion depth: 9 cm Catheter type: closed end flexible Catheter size: 19 Gauge Catheter at skin depth: 17 cm Test dose: negative  Assessment Events: blood not aspirated, injection not painful, no injection resistance, negative IV test and no paresthesia

## 2015-06-29 NOTE — H&P (Signed)
35 y.o. 675w2d  G3P2002 comes in for elective induction at term.  Otherwise has good fetal movement and no bleeding.  Past Medical History  Diagnosis Date  . PCOS (polycystic ovarian syndrome)   . ADHD (attention deficit hyperactivity disorder)   . Vaginal Pap smear, abnormal 1992, 1993    abnormal pap on two separate occasions, with biopsy done, which were negative  . GERD (gastroesophageal reflux disease)     during pregnancy only, states was not bad enough to have to take medication    Past Surgical History  Procedure Laterality Date  . Adenoidectomy  1985    OB History  Gravida Para Term Preterm AB SAB TAB Ectopic Multiple Living  3 2 2  0 0 0 0 0 0 2    # Outcome Date GA Lbr Len/2nd Weight Sex Delivery Anes PTL Lv  3 Current           2 Term 07/18/10    Kateri PlummerM Vag-Spont  N Y  1 Term 09/25/07    Kateri PlummerM Vag-Spont  N Y      Social History   Social History  . Marital Status: Single    Spouse Name: N/A  . Number of Children: N/A  . Years of Education: N/A   Occupational History  . Not on file.   Social History Main Topics  . Smoking status: Never Smoker   . Smokeless tobacco: Never Used  . Alcohol Use: No  . Drug Use: No  . Sexual Activity: Yes   Other Topics Concern  . Not on file   Social History Narrative   Review of patient's allergies indicates no known allergies.    Prenatal Transfer Tool  Maternal Diabetes: No Genetic Screening: Declined Maternal Ultrasounds/Referrals: Normal Fetal Ultrasounds or other Referrals:  None Maternal Substance Abuse:  No Significant Maternal Medications:  None Significant Maternal Lab Results: None  Other PNC: uncomplicated.    There were no vitals filed for this visit.   Lungs/Cor:  NAD Abdomen:  soft, gravid Ex:  no cords, erythema SVE:  3/50/-2 in office, VTX. FHTs:  120s, good STV, NST R Toco:  Qq 3-5   A/P   Term induction.  GBS neg.  Gabor Lusk A

## 2015-06-29 NOTE — Anesthesia Pain Management Evaluation Note (Signed)
  CRNA Pain Management Visit Note  Patient: Sierra Jackson, 35 y.o., female  "Hello I am a [facility] of the [facility]. We have an anesthesia team available at all times to provide care throughout the hospital, including epidural management and anesthesia for C-section. I don't know your plan for the delivery whether it a natural birth, water birth, IV sedation, nitrous supplementation, doula or epidural, but we want to meet your pain goals."   1.Was your pain managed to your expectations on prior hospitalizations?   Yes   2.What is your expectation for pain management during this hospitalization?     Epidural  3.How can we help you reach that goal? epidural  Record the patient's initial score and the patient's pain goal.   Pain: 0  Pain Goal: 6 [facility] wants you to be able to say your pain was always managed very well.  Cephus ShellingBURGER,Babara Buffalo 06/29/2015

## 2015-06-30 LAB — CBC
HCT: 34.8 % — ABNORMAL LOW (ref 36.0–46.0)
Hemoglobin: 11.9 g/dL — ABNORMAL LOW (ref 12.0–15.0)
MCH: 28.7 pg (ref 26.0–34.0)
MCHC: 34.2 g/dL (ref 30.0–36.0)
MCV: 84.1 fL (ref 78.0–100.0)
PLATELETS: 279 10*3/uL (ref 150–400)
RBC: 4.14 MIL/uL (ref 3.87–5.11)
RDW: 15.4 % (ref 11.5–15.5)
WBC: 16.1 10*3/uL — ABNORMAL HIGH (ref 4.0–10.5)

## 2015-06-30 LAB — RPR: RPR: NONREACTIVE

## 2015-06-30 NOTE — Lactation Note (Signed)
This note was copied from a baby's chart. Lactation Consultation Note  Patient Name: Boy Jennye MoccasinCourtney Zanetti ZOXWR'UToday's Date: 06/30/2015 Reason for consult: Initial assessment Mom plans to pump/bottle feed. She has started to pump but reports receiving small amount of colostrum with hand expression. She is spoon feeding colostrum to baby then finishing the feeding with formula via bottle. Mom had difficulty with latch with older children resulting in pump/bottle feeding. She does not plan to work on latching this baby. Encouraged to pump every 3 hours for 15 minutes, continue hand expression for more volume. Offered to check flanges, Mom declined. Lactation brochure left for review, advised of OP services and support group. Mom reports she has DEBP at home. Supplemental guidelines for pump/bottle feeding given to Mom. Encouraged to call for questions/concerns.   Maternal Data Has patient been taught Hand Expression?: No (Mom reports she knows how to hand express) Does the patient have breastfeeding experience prior to this delivery?: Yes  Feeding Feeding Type: Bottle Fed - Formula Nipple Type: Slow - flow  LATCH Score/Interventions                      Lactation Tools Discussed/Used Tools: Pump Breast pump type: Double-Electric Breast Pump WIC Program: Yes   Consult Status Consult Status: PRN    Alfred LevinsGranger, Glynn Yepes Ann 06/30/2015, 3:13 PM

## 2015-06-30 NOTE — Progress Notes (Signed)
Post Partum Day 1 Subjective: no complaints, up ad lib, voiding and tolerating PO  Objective: Blood pressure 123/85, pulse 87, temperature 97.8 F (36.6 C), temperature source Oral, resp. rate 18, height 5\' 7"  (1.702 m), weight 120.657 kg (266 lb), last menstrual period 08/13/2014, SpO2 97 %, unknown if currently breastfeeding.  Physical Exam:  General: alert, cooperative and appears stated age Lochia: appropriate Uterine Fundus: firm    Recent Labs  06/29/15 1505 06/30/15 0544  HGB 13.3 11.9*  HCT 38.5 34.8*    Assessment/Plan: Plan for discharge tomorrow and Breastfeeding  Desires neonatal circumcision, R/B/A of procedure discussed at length. Pt understands that neonatal circumcision is not considered medically necessary and is elective. The risks include, but are not limited to bleeding, infection, damage to the penis, development of scar tissue, and having to have it redone at a later date. Pt understands theses risks and wishes to proceed    LOS: 1 day   Clayson Riling H. 06/30/2015, 8:59 AM

## 2015-06-30 NOTE — Anesthesia Postprocedure Evaluation (Signed)
Anesthesia Post Note  Patient: Sierra Jackson  Procedure(s) Performed: * No procedures listed *  Patient location during evaluation: Mother Baby Anesthesia Type: Epidural Level of consciousness: awake and alert and oriented Pain management: satisfactory to patient Vital Signs Assessment: post-procedure vital signs reviewed and stable Respiratory status: spontaneous breathing and nonlabored ventilation Cardiovascular status: stable Postop Assessment: no headache, no backache, no signs of nausea or vomiting, adequate PO intake and patient able to bend at knees (patient up walking) Anesthetic complications: no     Last Vitals:  Filed Vitals:   06/30/15 0020 06/30/15 0430  BP: 126/82 123/85  Pulse: 79 87  Temp: 36.6 C 36.6 C  Resp: 18 18    Last Pain:  Filed Vitals:   06/30/15 0834  PainSc: 3    Pain Goal: Patients Stated Pain Goal: 7 (06/29/15 1500)               Aubrina Nieman

## 2015-07-01 MED ORDER — SUCROSE 24% NICU/PEDS ORAL SOLUTION
OROMUCOSAL | Status: AC
  Filled 2015-07-01: qty 0.5

## 2015-07-01 NOTE — Discharge Summary (Signed)
Obstetric Discharge Summary Reason for Admission: induction of labor Prenatal Procedures: ultrasound Intrapartum Procedures: spontaneous vaginal delivery Postpartum Procedures: none Complications-Operative and Postpartum: 1st degree perineal laceration HEMOGLOBIN  Date Value Ref Range Status  06/30/2015 11.9* 12.0 - 15.0 g/dL Final   HCT  Date Value Ref Range Status  06/30/2015 34.8* 36.0 - 46.0 % Final    Physical Exam:  General: alert and cooperative Lochia: appropriate Uterine Fundus: firm DVT Evaluation: No evidence of DVT seen on physical exam.  Discharge Diagnoses: Term Pregnancy-delivered  Discharge Information: Date: 07/01/2015 Activity: pelvic rest Diet: routine Medications: PNV and Ibuprofen Condition: stable Instructions: refer to practice specific booklet Discharge to: home Follow-up Information    Follow up with Sierra A, MD In 4 weeks.   Specialty:  Obstetrics and Gynecology   Contact information:   [address] [place] [address] [phone]       Newborn Data: Live born female  Birth Weight: 8 lb 0.2 oz (3635 g) APGAR: 9, 9  Home with mother.  Sierra Jackson, Sierra Jackson 07/01/2015, 9:56 AM

## 2015-07-06 ENCOUNTER — Encounter (HOSPITAL_COMMUNITY): Payer: Self-pay

## 2015-07-06 ENCOUNTER — Inpatient Hospital Stay (HOSPITAL_COMMUNITY)
Admission: EM | Admit: 2015-07-06 | Discharge: 2015-07-08 | DRG: 776 | Disposition: A | Discharge status: Home / Self Care | Payer: Medicaid Other | Attending: Obstetrics and Gynecology | Admitting: Obstetrics and Gynecology

## 2015-07-06 ENCOUNTER — Emergency Department (HOSPITAL_COMMUNITY): Payer: Medicaid Other

## 2015-07-06 DIAGNOSIS — R509 Fever, unspecified: Secondary | ICD-10-CM

## 2015-07-06 DIAGNOSIS — N719 Inflammatory disease of uterus, unspecified: Secondary | ICD-10-CM | POA: Diagnosis present

## 2015-07-06 DIAGNOSIS — Z9889 Other specified postprocedural states: Secondary | ICD-10-CM

## 2015-07-06 DIAGNOSIS — O8612 Endometritis following delivery: Principal | ICD-10-CM | POA: Diagnosis present

## 2015-07-06 DIAGNOSIS — R102 Pelvic and perineal pain: Secondary | ICD-10-CM

## 2015-07-06 DIAGNOSIS — K219 Gastro-esophageal reflux disease without esophagitis: Secondary | ICD-10-CM | POA: Diagnosis present

## 2015-07-06 LAB — CBC WITH DIFFERENTIAL/PLATELET
BASOS ABS: 0 10*3/uL (ref 0.0–0.1)
Basophils Relative: 0 %
EOS ABS: 0.1 10*3/uL (ref 0.0–0.7)
EOS PCT: 1 %
HCT: 34.7 % — ABNORMAL LOW (ref 36.0–46.0)
HEMOGLOBIN: 11.3 g/dL — AB (ref 12.0–15.0)
Lymphocytes Relative: 7 %
Lymphs Abs: 0.7 10*3/uL (ref 0.7–4.0)
MCH: 28.3 pg (ref 26.0–34.0)
MCHC: 32.6 g/dL (ref 30.0–36.0)
MCV: 87 fL (ref 78.0–100.0)
Monocytes Absolute: 0.8 10*3/uL (ref 0.1–1.0)
Monocytes Relative: 8 %
NEUTROS PCT: 84 %
Neutro Abs: 8.5 10*3/uL — ABNORMAL HIGH (ref 1.7–7.7)
PLATELETS: 247 10*3/uL (ref 150–400)
RBC: 3.99 MIL/uL (ref 3.87–5.11)
RDW: 14.9 % (ref 11.5–15.5)
WBC: 10.2 10*3/uL (ref 4.0–10.5)

## 2015-07-06 LAB — BASIC METABOLIC PANEL
ANION GAP: 7 (ref 5–15)
BUN: 10 mg/dL (ref 6–20)
CO2: 25 mmol/L (ref 22–32)
Calcium: 8.5 mg/dL — ABNORMAL LOW (ref 8.9–10.3)
Chloride: 107 mmol/L (ref 101–111)
Creatinine, Ser: 1.05 mg/dL — ABNORMAL HIGH (ref 0.44–1.00)
Glucose, Bld: 91 mg/dL (ref 65–99)
POTASSIUM: 3.2 mmol/L — AB (ref 3.5–5.1)
SODIUM: 139 mmol/L (ref 135–145)

## 2015-07-06 LAB — URINALYSIS, ROUTINE W REFLEX MICROSCOPIC
Bilirubin Urine: NEGATIVE
Glucose, UA: NEGATIVE mg/dL
Hgb urine dipstick: NEGATIVE
KETONES UR: NEGATIVE mg/dL
LEUKOCYTES UA: NEGATIVE
NITRITE: NEGATIVE
PROTEIN: NEGATIVE mg/dL
Specific Gravity, Urine: 1.02 (ref 1.005–1.030)
pH: 5.5 (ref 5.0–8.0)

## 2015-07-06 MED ORDER — KETOROLAC TROMETHAMINE 30 MG/ML IJ SOLN
30.0000 mg | Freq: Once | INTRAMUSCULAR | Status: AC
  Administered 2015-07-06: 30 mg via INTRAVENOUS
  Filled 2015-07-06: qty 1

## 2015-07-06 MED ORDER — PRENATAL MULTIVITAMIN CH
1.0000 | ORAL_TABLET | Freq: Every day | ORAL | Status: DC
  Administered 2015-07-07: 1 via ORAL
  Filled 2015-07-06: qty 1

## 2015-07-06 MED ORDER — ONDANSETRON HCL 4 MG PO TABS: 4.0000 mg | ORAL_TABLET | Freq: Four times a day (QID) | ORAL | Status: DC | PRN

## 2015-07-06 MED ORDER — ONDANSETRON HCL 4 MG/2ML IJ SOLN: 4.0000 mg | Freq: Four times a day (QID) | INTRAMUSCULAR | Status: DC | PRN

## 2015-07-06 MED ORDER — IBUPROFEN 600 MG PO TABS
600.0000 mg | ORAL_TABLET | Freq: Four times a day (QID) | ORAL | Status: DC | PRN
  Administered 2015-07-06 – 2015-07-07 (×4): 600 mg via ORAL
  Filled 2015-07-06 (×4): qty 1

## 2015-07-06 MED ORDER — LACTATED RINGERS IV SOLN
INTRAVENOUS | Status: DC
  Administered 2015-07-06 – 2015-07-08 (×4): via INTRAVENOUS

## 2015-07-06 MED ORDER — PIPERACILLIN-TAZOBACTAM 3.375 G IVPB 30 MIN
3.3750 g | Freq: Once | INTRAVENOUS | Status: AC
  Administered 2015-07-06: 3.375 g via INTRAVENOUS
  Filled 2015-07-06: qty 50

## 2015-07-06 MED ORDER — LACTATED RINGERS IV BOLUS (SEPSIS)
1000.0000 mL | Freq: Once | INTRAVENOUS | Status: AC
  Administered 2015-07-06: 1000 mL via INTRAVENOUS

## 2015-07-06 MED ORDER — HYDROMORPHONE HCL 1 MG/ML IJ SOLN: 0.2000 mg | INTRAMUSCULAR | Status: DC | PRN

## 2015-07-06 MED ORDER — CLINDAMYCIN PHOSPHATE 900 MG/50ML IV SOLN
900.0000 mg | Freq: Three times a day (TID) | INTRAVENOUS | Status: DC
  Administered 2015-07-06 – 2015-07-08 (×5): 900 mg via INTRAVENOUS
  Filled 2015-07-06 (×6): qty 50

## 2015-07-06 MED ORDER — DEXTROSE 5 % IV SOLN
180.0000 mg | Freq: Two times a day (BID) | INTRAVENOUS | Status: DC
  Administered 2015-07-06 – 2015-07-08 (×4): 180 mg via INTRAVENOUS
  Filled 2015-07-06 (×4): qty 4.5

## 2015-07-06 MED ORDER — OXYCODONE-ACETAMINOPHEN 5-325 MG PO TABS: 1.0000 | ORAL_TABLET | ORAL | Status: DC | PRN

## 2015-07-06 MED ORDER — ACETAMINOPHEN 325 MG PO TABS
650.0000 mg | ORAL_TABLET | Freq: Once | ORAL | Status: AC
  Administered 2015-07-06: 650 mg via ORAL
  Filled 2015-07-06: qty 2

## 2015-07-06 NOTE — ED Notes (Signed)
Pt gave birth to 3rd child on 06/29/15.  Pt reports uncomplicated vaginal delivery and pregnancy.  Pt reports increased vaginal bleeding with cramping, HA and back pain that began this morning.  Pt reports soaking an overnight pad within an hour.

## 2015-07-06 NOTE — ED Provider Notes (Signed)
CSN: [id number]     Arrival date & time 07/06/15  7829 History   First MD Initiated Contact with Patient 07/06/15 [phone]     Chief Complaint  Patient presents with  . Vaginal Bleeding     (Consider location/radiation/quality/duration/timing/severity/associated sxs/prior Treatment) HPI Comments: Patient here with fever 1 day with associated excessive vaginal bleeding. She endorses abdominal cramping which is suprapubic in nature. Denies any cough or congestion. No vomiting. Fever has been up to 101 at home. No treatment used for this. She had a vaginal delivery 5 days ago. Denies any dysuria or hematuria. Patient had to be induced for childbirth.  Patient is a 35 y.o. female presenting with vaginal bleeding. The history is provided by the patient.  Vaginal Bleeding   Past Medical History  Diagnosis Date  . PCOS (polycystic ovarian syndrome)   . ADHD (attention deficit hyperactivity disorder)   . Vaginal Pap smear, abnormal 1992, 1993    abnormal pap on two separate occasions, with biopsy done, which were negative  . GERD (gastroesophageal reflux disease)     during pregnancy only, states was not bad enough to have to take medication   Past Surgical History  Procedure Laterality Date  . Adenoidectomy  1985   History reviewed. No pertinent family history. Social History  Substance Use Topics  . Smoking status: Never Smoker   . Smokeless tobacco: Never Used  . Alcohol Use: No   OB History    Gravida Para Term Preterm AB TAB SAB Ectopic Multiple Living   0 0 0 0 0 0 3     Review of Systems  Genitourinary: Positive for vaginal bleeding.  All other systems reviewed and are negative.     Allergies  Review of patient's allergies indicates no known allergies.  Home Medications   Prior to Admission medications   Medication Sig Start Date End Date Taking? Authorizing Provider  Prenatal Vit-Fe Fumarate-FA (PRENATAL MULTIVITAMIN) TABS tablet Take 1 tablet by mouth daily  at 12 noon.    Historical Provider, MD   BP 132/85 mmHg  Pulse 108  Temp(Src) 100.4 F (38 C) (Oral)  Resp 22  Ht  (1.676 m)  Wt 117.935 kg  BMI 41.99 kg/m2  SpO2 96%  LMP 08/13/2014 Physical Exam  Constitutional: She is oriented to person, place, and time. She appears well-developed and well-nourished.  Non-toxic appearance. No distress.  HENT:  Head: Normocephalic and atraumatic.  Eyes: Conjunctivae, EOM and lids are normal. Pupils are equal, round, and reactive to light.  Neck: Normal range of motion. Neck supple. No tracheal deviation present. No thyroid mass present.  Cardiovascular: Normal rate, regular rhythm and normal heart sounds.  Exam reveals no gallop.   No murmur heard. Pulmonary/Chest: Effort normal and breath sounds normal. No stridor. No respiratory distress. She has no decreased breath sounds. She has no wheezes. She has no rhonchi. She has no rales.  Abdominal: Soft. Normal appearance and bowel sounds are normal. She exhibits no distension. There is no tenderness. There is no rigidity, no rebound, no guarding and no CVA tenderness.  Musculoskeletal: Normal range of motion. She exhibits no edema or tenderness.  Neurological: She is alert and oriented to person, place, and time. She has normal strength. No cranial nerve deficit or sensory deficit. GCS eye subscore is 4. GCS verbal subscore is 5. GCS motor subscore is 6.  Skin: Skin is warm and dry. No abrasion and no rash noted.  Psychiatric: She has a normal  mood and affect. Her speech is normal and behavior is normal.  Nursing note and vitals reviewed.   ED Course  Procedures (including critical care time) Labs Review Labs Reviewed  URINE CULTURE  CBC WITH DIFFERENTIAL/PLATELET  BASIC METABOLIC PANEL  URINALYSIS, ROUTINE W REFLEX MICROSCOPIC (NOT AT [facility])    Imaging Review No results found. I have personally reviewed and evaluated these images and lab results as part of my medical decision-making.    EKG Interpretation None      MDM   Final diagnoses:  None    Patient given dose of IV antibiotics here. Spoke with Dr. Henderson CloudHorvath from GYN and [facility] be [facility] for possible D and C due to retained products of conception. I informed her that I had not had a chance to do a pelvic exam and she said that was okay she would do it there  Sierra NickAnthony Kdyn Vonbehren, MD 07/06/15 1421

## 2015-07-06 NOTE — H&P (Signed)
Sierra Jackson is a 35 y.o. female presenting for chills and vaginal bleeding  35 yo G3P3 1 week s/p an uncomplicated SVD. This morning she went to the bathroom and changed her pad. When she returned to her bed she had more gushes of blood, increased cramping and back pain and began feeling like she had chills. [facility]. In the ER she had a temp of 100.4.  History OB History    Gravida Para Term Preterm AB TAB SAB Ectopic Multiple Living   3 3 3  0 0 0 0 0 0 3     Past Medical History  Diagnosis Date  . PCOS (polycystic ovarian syndrome)   . ADHD (attention deficit hyperactivity disorder)   . Vaginal Pap smear, abnormal 1992, 1993    abnormal pap on two separate occasions, with biopsy done, which were negative  . GERD (gastroesophageal reflux disease)     during pregnancy only, states was not bad enough to have to take medication   Past Surgical History  Procedure Laterality Date  . Adenoidectomy  1985  . Wisdom tooth extraction     Family History: family history is not on file. Social History:  reports that she has never smoked. She has never used smokeless tobacco. She reports that she does not drink alcohol or use illicit drugs.   ROS: as above    Blood pressure 133/69, pulse 96, temperature 100 F (37.8 C), temperature source Oral, resp. rate 22, height 5\' 6"  (1.676 m), weight 117.935 kg (260 lb), SpO2 99 %, unknown if currently breastfeeding. Exam Physical Exam  AOX3, uncomfortable appearing Abdomen soft, tender to deep palpation. No rebound or guarding NEFG Vagina with normal appearing lochia Cvx normal appearing, no active bleeding.  Uterus 14-16 week size, mildly tender, no chandelier sign. No increased bleeding with exam Right medial thigh with open sinus tract, faint ring of surrounding erythema with mild induration. No active drainage   Assessment/Plan: 1) Admit for IV abx for PP endometritis 2) [facility]  reports thickened endometrium measuring 4.7 cm concerning for retained POCS versus normal PP endometrium. After reviewing the images the measured area does not appear to show obvious retained products. No doppler of the endometrium was performed. Minimal bleeding on exam. Given clinical picture will defer D&C at this time and proceed with IV abx for 24 hours. If pts clinical picture does not improve with abx then would consider proceeding with D&C. R/B/A of postponing D&C for IV abx discussed with pt and she is comfortable with the current plan. Will plan clinda and gent  Goldman Birchall H. 07/06/2015, 6:10 PM

## 2015-07-06 NOTE — MAU Note (Signed)
Postpartum 1 week.  Vaginal delivery. Bleeding worsened today and had chills and shakes.  Passed small clot at other facility.

## 2015-07-06 NOTE — Progress Notes (Signed)
Pharmacy Antibiotic Note  Sierra Jackson is a 35 y.o. female admitted on 07/06/2015 with postpartum fever, cramping and bleeding.  Pharmacy has been consulted for Gentamicin dosing for postpartum endometritis. Pt is 1 week s/p SVD.  Plan: 1. Gentamicin 180mg  IV q12h 2. Will monitor renal function and if SCr decreases, pt may need dosage adjustment.  Height: 5\' 6"  (167.6 cm) Weight: 260 lb (117.935 kg) IBW/kg (Calculated) : 59.3  Temp (24hrs), Avg:99.6 F (37.6 C), Min:98.5 F (36.9 C), Max:100.4 F (38 C)   Recent Labs Lab 06/30/15 0544 07/06/15 0933  WBC 16.1* 10.2  CREATININE  --  1.05*    Estimated Creatinine Clearance: 100.4 mL/min (by C-G formula based on Cr of 1.05).    No Known Allergies  Antimicrobials this admission: Clindamycin 900mg  IV q8h 5/23 >>        Thank you for allowing pharmacy to be a part of this patient's care.  Claybon Jabsngel, Sarinah Doetsch G 07/06/2015 6:43 PM

## 2015-07-06 NOTE — MAU Provider Note (Signed)
History     CSN: [id number]  Arrival date and time: 07/06/15 0903   First Provider Initiated Contact with Patient 07/06/15 1707      Chief Complaint  Patient presents with  . Vaginal Bleeding   HPI    Ms.Sierra Jackson is a 35 y.o. female G3P3003 status post SVD 7/89; no complications. Patient has a history of hidradenitis ; concerned about an area on her leg. Requesting it be looked at here.   Today, she started noticing an increase in vaginal bleeding; she saturated 2-3 pads throughout the day.  She presented to [facility] for evaluation and was sent here for further evaluation of possible retain POC.  She reports onset of fever today with highest reading of 100.4.   Currently she is experiencing strong cramping in lower abdomen and HA, she is requesting something for pain.    OB History    Gravida Para Term Preterm AB TAB SAB Ectopic Multiple Living   '3 3 3 '$ 0 0 0 0 0 0 3      Past Medical History  Diagnosis Date  . PCOS (polycystic ovarian syndrome)   . ADHD (attention deficit hyperactivity disorder)   . Vaginal Pap smear, abnormal 1992, 1993    abnormal pap on two separate occasions, with biopsy done, which were negative  . GERD (gastroesophageal reflux disease)     during pregnancy only, states was not bad enough to have to take medication    Past Surgical History  Procedure Laterality Date  . Adenoidectomy  1985  . Wisdom tooth extraction      History reviewed. No pertinent family history.  Social History  Substance Use Topics  . Smoking status: Never Smoker   . Smokeless tobacco: Never Used  . Alcohol Use: No    Allergies: No Known Allergies  Prescriptions prior to admission  Medication Sig Dispense Refill Last Dose  . ibuprofen (ADVIL,MOTRIN) 600 MG tablet Take 600 mg by mouth every 6 (six) hours as needed (pain).   07/06/2015 at 0300  . Prenat-FeFum-FePo-FA-Omega 3 (CONCEPT DHA) 53.5-38-1 MG CAPS Take 1 capsule by mouth daily.  0 07/06/2015 at  Unknown time   Results for orders placed or performed during the hospital encounter of 07/06/15 (from the past 48 hour(s))  CBC with Differential     Status: Abnormal   Collection Time: 07/06/15  9:33 AM  Result Value Ref Range   WBC 10.2 4.0 - 10.5 K/uL   RBC 3.99 3.87 - 5.11 MIL/uL   Hemoglobin 11.3 (L) 12.0 - 15.0 g/dL   HCT 34.7 (L) 36.0 - 46.0 %   MCV 87.0 78.0 - 100.0 fL   MCH 28.3 26.0 - 34.0 pg   MCHC 32.6 30.0 - 36.0 g/dL   RDW 14.9 11.5 - 15.5 %   Platelets 247 150 - 400 K/uL   Neutrophils Relative % 84 %   Neutro Abs 8.5 (H) 1.7 - 7.7 K/uL   Lymphocytes Relative 7 %   Lymphs Abs 0.7 0.7 - 4.0 K/uL   Monocytes Relative 8 %   Monocytes Absolute 0.8 0.1 - 1.0 K/uL   Eosinophils Relative 1 %   Eosinophils Absolute 0.1 0.0 - 0.7 K/uL   Basophils Relative 0 %   Basophils Absolute 0.0 0.0 - 0.1 K/uL  Basic metabolic panel     Status: Abnormal   Collection Time: 07/06/15  9:33 AM  Result Value Ref Range   Sodium 139 135 - 145 mmol/L   Potassium 3.2 (L)  3.5 - 5.1 mmol/L   Chloride 107 101 - 111 mmol/L   CO2 25 22 - 32 mmol/L   Glucose, Bld 91 65 - 99 mg/dL   BUN 10 6 - 20 mg/dL   Creatinine, Ser 1.05 (H) 0.44 - 1.00 mg/dL   Calcium 8.5 (L) 8.9 - 10.3 mg/dL   GFR calc non Af Amer >60 >60 mL/min   GFR calc Af Amer >60 >60 mL/min    Comment: (NOTE) The eGFR has been calculated using the CKD EPI equation. This calculation has not been validated in all clinical situations. eGFR's persistently <60 mL/min signify possible Chronic Kidney Disease.    Anion gap 7 5 - 15  Urinalysis, Routine w reflex microscopic (not at [facility])     Status: None   Collection Time: 07/06/15  9:53 AM  Result Value Ref Range   Color, Urine YELLOW YELLOW   APPearance CLEAR CLEAR   Specific Gravity, Urine 1.020 1.005 - 1.030   pH 5.5 5.0 - 8.0   Glucose, UA NEGATIVE NEGATIVE mg/dL   Hgb urine dipstick NEGATIVE NEGATIVE   Bilirubin Urine NEGATIVE NEGATIVE   Ketones, ur NEGATIVE NEGATIVE mg/dL    Protein, ur NEGATIVE NEGATIVE mg/dL   Nitrite NEGATIVE NEGATIVE   Leukocytes, UA NEGATIVE NEGATIVE    Comment: MICROSCOPIC NOT DONE ON URINES WITH NEGATIVE PROTEIN, BLOOD, LEUKOCYTES, NITRITE, OR GLUCOSE <1000 mg/dL.   US Transvaginal Non-ob  07/06/2015  CLINICAL DATA:  Heavy bleeding, cramping, fever.  Postpartum 1 week. EXAM: TRANSABDOMINAL AND TRANSVAGINAL ULTRASOUND OF PELVIS TECHNIQUE: Both transabdominal and transvaginal ultrasound examinations of the pelvis were performed. Transabdominal technique was performed for global imaging of the pelvis including uterus, ovaries, adnexal regions, and pelvic cul-de-sac. It was necessary to proceed with endovaginal exam following the transabdominal exam to visualize the uterus, endometrium, ovaries and adnexa . COMPARISON:  None FINDINGS: Uterus Measurements: 18.8 x 9.0 x 2.1 cm. No fibroids or other mass visualized. Endometrium Thickness: Markedly thickened at 4.7 cm. Right ovary Measurements: Not visualize. No adnexal mass seen. Left ovary Measurements: 4.6 x 3.0 x 4.6 cm. Normal appearance/no adnexal mass. Other findings No abnormal free fluid. IMPRESSION: Enlarged postpartum uterus. Marked thickening of the endometrium, measuring up to 4.7 cm. This could reflect postpartum hemorrhage or retained products of conception. Electronically Signed   By: Rolm Baptise M.D.   On: 07/06/2015 12:31   US Pelvis Complete  07/06/2015  CLINICAL DATA:  Heavy bleeding, cramping, fever.  Postpartum 1 week. EXAM: TRANSABDOMINAL AND TRANSVAGINAL ULTRASOUND OF PELVIS TECHNIQUE: Both transabdominal and transvaginal ultrasound examinations of the pelvis were performed. Transabdominal technique was performed for global imaging of the pelvis including uterus, ovaries, adnexal regions, and pelvic cul-de-sac. It was necessary to proceed with endovaginal exam following the transabdominal exam to visualize the uterus, endometrium, ovaries and adnexa . COMPARISON:  None FINDINGS: Uterus  Measurements: 18.8 x 9.0 x 2.1 cm. No fibroids or other mass visualized. Endometrium Thickness: Markedly thickened at 4.7 cm. Right ovary Measurements: Not visualize. No adnexal mass seen. Left ovary Measurements: 4.6 x 3.0 x 4.6 cm. Normal appearance/no adnexal mass. Other findings No abnormal free fluid. IMPRESSION: Enlarged postpartum uterus. Marked thickening of the endometrium, measuring up to 4.7 cm. This could reflect postpartum hemorrhage or retained products of conception. Electronically Signed   By: Rolm Baptise M.D.   On: 07/06/2015 12:31    Review of Systems  Constitutional: Positive for fever and chills.  Gastrointestinal: Positive for abdominal pain.  Musculoskeletal: Positive for back pain.  Neurological: Positive for headaches.   Physical Exam   Blood pressure 133/69, pulse 96, temperature 100 F (37.8 C), temperature source Oral, resp. rate 22, height '5\' 6"'$  (1.676 m), weight 260 lb (117.935 kg), SpO2 99 %, unknown if currently breastfeeding.  Physical Exam  Constitutional: She is oriented to person, place, and time. She appears well-developed and well-nourished. No distress.  Respiratory: Effort normal.  GI: There is generalized tenderness. There is no rigidity, no rebound and no guarding.  Musculoskeletal: Normal range of motion.  Neurological: She is alert and oriented to person, place, and time.  Skin: Skin is warm. She is not diaphoretic.     1/4 cm lesion noted on mid- anterior thigh. Open, scant, clear drainage. Non-odorous. No erythema. Non tender to palpation.     MAU Course  Procedures  None  MDM  Toradol 30 mg IV Dr. Harrington Challenger to MAU to see patient.   Assessment and Plan   A:  1. Endometritis   2. Pain in female pelvis   3. Fever     P:  Admit per Dr. Harrington Challenger.    Lezlie Lye, NP 07/06/2015 5:11 PM

## 2015-07-07 LAB — COMPREHENSIVE METABOLIC PANEL
ALBUMIN: 2.1 g/dL — AB (ref 3.5–5.0)
ALT: 53 U/L (ref 14–54)
AST: 37 U/L (ref 15–41)
Alkaline Phosphatase: 70 U/L (ref 38–126)
Anion gap: 7 (ref 5–15)
BUN: 12 mg/dL (ref 6–20)
CHLORIDE: 108 mmol/L (ref 101–111)
CO2: 26 mmol/L (ref 22–32)
CREATININE: 0.91 mg/dL (ref 0.44–1.00)
Calcium: 8.2 mg/dL — ABNORMAL LOW (ref 8.9–10.3)
GFR calc non Af Amer: >60 mL/min (ref >60)
Glucose, Bld: 101 mg/dL — ABNORMAL HIGH (ref 65–99)
POTASSIUM: 3.7 mmol/L (ref 3.5–5.1)
SODIUM: 141 mmol/L (ref 135–145)
TOTAL PROTEIN: 5.2 g/dL — AB (ref 6.5–8.1)
Total Bilirubin: 0.5 mg/dL (ref 0.3–1.2)

## 2015-07-07 LAB — URINE CULTURE: Culture: NO GROWTH

## 2015-07-07 NOTE — Lactation Note (Signed)
Lactation Consultation Note  Patient Name: Sierra Jackson [id number] Date: 07/07/2015   Consult with mom readmitted post partum for fever and ATB. Mom reports she is pumping several times a day and bottle feeding infant EBM/formula. Mom reports she has a PIS and a pump kit at home. Gave her a manual pump to use until dad is able to go home and get her pump or pump kit. Informed mom of [facility] if needed. Follow up as needed.      Maternal Data    Feeding    LATCH Score/Interventions                      Lactation Tools Discussed/Used     Consult Status      Donn Pierini 07/07/2015, 12:00 PM

## 2015-07-07 NOTE — Progress Notes (Signed)
HD #1 Patient G3P3 post partum day 8 s/p SVD admitted overnight for dx of post partum endomyometritis.  She was febrile in ED and this am at 08:00 to Tmax of 100.4  At time of admission she had some bleeding and pain.  US was done which was negative for RPOC.   Subjective: This am patient notes feeling much better. She denies fever or chills.  She denies heavy VB or pain.   Objective: I have reviewed patient's vital signs, medications, labs and radiology results.  General: alert and cooperative GI: soft, non-tender; bowel sounds normal; no masses,  no organomegaly and normal findings: soft, non-tender Extremities: extremities normal, atraumatic, no cyanosis or edema and Homans sign is negative, no sign of DVT Vaginal Bleeding: minimal   Assessment/Plan: 35  Yo P3 admitted with post partum endometritis.  She has defervesced with IV antibiotics  Will continue to closely monitor.  Patient was counseled that we would like 24 hrs of IV abx therapy and 24 hrs of being afebrile.  Last temp was this am at 08:00     LOS: 1 day    Sierra Jackson STACIA 07/07/2015, 12:48 PM

## 2015-07-08 NOTE — Progress Notes (Signed)
Pt ambulated out with husband teaching comp[lete

## 2015-07-08 NOTE — Progress Notes (Addendum)
Patient is eating, ambulating, voiding.  Pain control is good.  Afebrile for >24 hours.  Filed Vitals:   07/07/15 1319 07/07/15 1748 07/07/15 2116 07/08/15 0550  BP: 121/78 124/81 131/88 136/80  Pulse: 79 77 72 63  Temp: 99.1 F (37.3 C) 98.3 F (36.8 C) 98.1 F (36.7 C) 98.2 F (36.8 C)  TempSrc: Oral Oral Oral Oral  Resp: 18 18 18 16   Height:      Weight:      SpO2: 97% 97% 99% 100%    Fundus firm and non tender.  Below umbilicus. Perineum without swelling. Pelvic exam: nontender, cervix closed and no CMT, no masses, normal size pp uterus, no bleeding.  Lab Results  Component Value Date   WBC 10.2 07/06/2015   HGB 11.3* 07/06/2015   HCT 34.7* 07/06/2015   MCV 87.0 07/06/2015   PLT 247 07/06/2015     A/P Post partum day 9 readmitted for pp endometritis.  Pt has responded to IV antibiotics.  US had question of POC vs clot- most likely clot and sx are resolving.  Precautions give.  Routine care.  Expect d/c today.  Will not give further antibiotics.  Will Schier A

## 2015-07-08 NOTE — Discharge Summary (Addendum)
Physician Discharge Summary  Patient ID: Sierra Jackson MRN: [id number] DOB/AGE: October 04, 1980 35 y.o.  Admit date: 07/06/2015 Discharge date: 07/08/2015  Admission Diagnoses:pp endometritis Discharge Diagnoses: same Active Problems:   Postpartum endometritis   Discharged Condition: good  Hospital Course: Uncomplicated antibiotics and defervescence in 24 hours.  Consults: None  Significant Diagnostic Studies: labs:  Results for orders placed or performed during the hospital encounter of 07/06/15 (from the past 72 hour(s))  CBC with Differential     Status: Abnormal   Collection Time: 07/06/15  9:33 AM  Result Value Ref Range   WBC 10.2 4.0 - 10.5 K/uL   RBC 3.99 3.87 - 5.11 MIL/uL   Hemoglobin 11.3 (L) 12.0 - 15.0 g/dL   HCT 34.7 (L) 36.0 - 46.0 %   MCV 87.0 78.0 - 100.0 fL   MCH 28.3 26.0 - 34.0 pg   MCHC 32.6 30.0 - 36.0 g/dL   RDW 14.9 11.5 - 15.5 %   Platelets 247 150 - 400 K/uL   Neutrophils Relative % 84 %   Neutro Abs 8.5 (H) 1.7 - 7.7 K/uL   Lymphocytes Relative 7 %   Lymphs Abs 0.7 0.7 - 4.0 K/uL   Monocytes Relative 8 %   Monocytes Absolute 0.8 0.1 - 1.0 K/uL   Eosinophils Relative 1 %   Eosinophils Absolute 0.1 0.0 - 0.7 K/uL   Basophils Relative 0 %   Basophils Absolute 0.0 0.0 - 0.1 K/uL  Basic metabolic panel     Status: Abnormal   Collection Time: 07/06/15  9:33 AM  Result Value Ref Range   Sodium 139 135 - 145 mmol/L   Potassium 3.2 (L) 3.5 - 5.1 mmol/L   Chloride 107 101 - 111 mmol/L   CO2 25 22 - 32 mmol/L   Glucose, Bld 91 65 - 99 mg/dL   BUN 10 6 - 20 mg/dL   Creatinine, Ser 1.05 (H) 0.44 - 1.00 mg/dL   Calcium 8.5 (L) 8.9 - 10.3 mg/dL   GFR calc non Af Amer >60 >60 mL/min   GFR calc Af Amer >60 >60 mL/min    Comment: (NOTE) The eGFR has been calculated using the CKD EPI equation. This calculation has not been validated in all clinical situations. eGFR's persistently <60 mL/min signify possible Chronic Kidney Disease.    Anion gap 7 5 -  15  Urinalysis, Routine w reflex microscopic (not at [facility])     Status: None   Collection Time: 07/06/15  9:53 AM  Result Value Ref Range   Color, Urine YELLOW YELLOW   APPearance CLEAR CLEAR   Specific Gravity, Urine 1.020 1.005 - 1.030   pH 5.5 5.0 - 8.0   Glucose, UA NEGATIVE NEGATIVE mg/dL   Hgb urine dipstick NEGATIVE NEGATIVE   Bilirubin Urine NEGATIVE NEGATIVE   Ketones, ur NEGATIVE NEGATIVE mg/dL   Protein, ur NEGATIVE NEGATIVE mg/dL   Nitrite NEGATIVE NEGATIVE   Leukocytes, UA NEGATIVE NEGATIVE    Comment: MICROSCOPIC NOT DONE ON URINES WITH NEGATIVE PROTEIN, BLOOD, LEUKOCYTES, NITRITE, OR GLUCOSE <1000 mg/dL.  Urine culture     Status: None   Collection Time: 07/06/15  9:53 AM  Result Value Ref Range   Specimen Description URINE, CATHETERIZED    Special Requests NONE    Culture NO GROWTH    Report Status 07/07/2015 FINAL   Comprehensive metabolic panel     Status: Abnormal   Collection Time: 07/07/15  5:40 AM  Result Value Ref Range   Sodium 141 135 -  145 mmol/L   Potassium 3.7 3.5 - 5.1 mmol/L   Chloride 108 101 - 111 mmol/L   CO2 26 22 - 32 mmol/L   Glucose, Bld 101 (H) 65 - 99 mg/dL   BUN 12 6 - 20 mg/dL   Creatinine, Ser 0.91 0.44 - 1.00 mg/dL   Calcium 8.2 (L) 8.9 - 10.3 mg/dL   Total Protein 5.2 (L) 6.5 - 8.1 g/dL   Albumin 2.1 (L) 3.5 - 5.0 g/dL   AST 37 15 - 41 U/L   ALT 53 14 - 54 U/L   Alkaline Phosphatase 70 38 - 126 U/L   Total Bilirubin 0.5 0.3 - 1.2 mg/dL   GFR calc non Af Amer >60 >60 mL/min   GFR calc Af Amer >60 >60 mL/min    Comment: (NOTE) The eGFR has been calculated using the CKD EPI equation. This calculation has not been validated in all clinical situations. eGFR's persistently <60 mL/min signify possible Chronic Kidney Disease.    Anion gap 7 5 - 15    Treatments: IV hydration and antibiotics: gentamycin and clinda  Discharge Exam: Blood pressure 136/80, pulse 63, temperature 98.2 F (36.8 C), temperature source Oral, resp. rate  16, height '5\' 6"'$  (1.676 m), weight 117.935 kg (260 lb), SpO2 100 %, unknown if currently breastfeeding.   Disposition: 01-Home or Self Care  Discharge Instructions    Call MD for:  temperature >100.4    Complete by:  As directed      Diet - low sodium heart healthy    Complete by:  As directed      Discharge instructions    Complete by:  As directed   Routine activity.  Take over the counter ibuprophen up to 800 mg every 8 hours for pain.     Discharge wound care:    Complete by:  As directed   Sitz baths and icepacks to perineum.  If stitches, they will dissolve.     Sexual acrtivity    Complete by:  As directed   Nothing per vagina for 6 weeks.            Medication List    TAKE these medications        CONCEPT DHA 53.5-38-1 MG Caps  Take 1 capsule by mouth daily.     ibuprofen 600 MG tablet  Commonly known as:  ADVIL,MOTRIN  Take 600 mg by mouth every 6 (six) hours as needed (pain).         Signed: Jaydenn Boccio A 07/08/2015, 8:41 AM
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
## 2016-12-18 LAB — OB RESULTS CONSOLE ABO/RH: RH TYPE: POSITIVE

## 2016-12-18 LAB — OB RESULTS CONSOLE HIV ANTIBODY (ROUTINE TESTING): HIV: NONREACTIVE

## 2016-12-18 LAB — OB RESULTS CONSOLE RUBELLA ANTIBODY, IGM: RUBELLA: IMMUNE

## 2016-12-18 LAB — OB RESULTS CONSOLE GC/CHLAMYDIA
Chlamydia: NEGATIVE
GC PROBE AMP, GENITAL: NEGATIVE

## 2016-12-18 LAB — OB RESULTS CONSOLE RPR: RPR: NONREACTIVE

## 2016-12-18 LAB — OB RESULTS CONSOLE ANTIBODY SCREEN: Antibody Screen: NEGATIVE

## 2016-12-18 LAB — OB RESULTS CONSOLE HEPATITIS B SURFACE ANTIGEN: HEP B S AG: NEGATIVE

## 2017-01-12 ENCOUNTER — Other Ambulatory Visit (HOSPITAL_COMMUNITY): Payer: Self-pay | Admitting: Obstetrics and Gynecology

## 2017-01-12 DIAGNOSIS — Z3A21 21 weeks gestation of pregnancy: Secondary | ICD-10-CM

## 2017-01-12 DIAGNOSIS — Z3689 Encounter for other specified antenatal screening: Secondary | ICD-10-CM

## 2017-01-18 ENCOUNTER — Encounter (HOSPITAL_COMMUNITY): Payer: Self-pay

## 2017-01-18 ENCOUNTER — Ambulatory Visit (HOSPITAL_COMMUNITY)
Admission: RE | Admit: 2017-01-18 | Discharge: 2017-01-18 | Disposition: A | Discharge status: Home / Self Care | Payer: Medicaid Other | Source: Ambulatory Visit | Attending: Obstetrics and Gynecology | Admitting: Obstetrics and Gynecology

## 2017-01-18 ENCOUNTER — Other Ambulatory Visit: Payer: Self-pay

## 2017-01-18 DIAGNOSIS — Z3A21 21 weeks gestation of pregnancy: Secondary | ICD-10-CM

## 2017-01-18 DIAGNOSIS — Z3689 Encounter for other specified antenatal screening: Secondary | ICD-10-CM | POA: Diagnosis not present

## 2017-01-18 DIAGNOSIS — O99212 Obesity complicating pregnancy, second trimester: Secondary | ICD-10-CM | POA: Diagnosis not present

## 2017-02-13 NOTE — L&D Delivery Note (Addendum)
Delivery Note Pt complete with bulging bag. Ruptured and moderate amount of clear fluid noted. Pt pushed with first contraction afterwards and  at 5:19 PM a viable female was delivered via Vaginal, Spontaneous (Presentation:ROA ;  ).  APGAR: 8,9 , ; weight pending  .   Placenta status: delivered intact, schultz , .  Cord:3vc  with the following complications: none.  Cord pH: n/a  Anesthesia:  Epidural Episiotomy:  none Lacerations:  none Suture Repair: n/a Est. Blood Loss (mL):  250  Mom to postpartum.  Baby to Couplet care / Skin to Skin.  Pt decided no longer wants BTL. Will consider paraguard  Desires circumcision in hospital tomorrow  Cathrine MusterCecilia W Annalyce Jackson 06/05/2017, 5:30 PM

## 2017-02-22 IMAGING — US US PELVIS COMPLETE
1 series · 14 of 25 positions shown · non-contrast
Comparison: None

CLINICAL DATA: Heavy bleeding, cramping, fever.  Postpartum 1 week.

EXAM:
TRANSABDOMINAL AND TRANSVAGINAL ULTRASOUND OF PELVIS
TECHNIQUE: Both transabdominal and transvaginal ultrasound examinations of the
pelvis were performed. Transabdominal technique was performed for
global imaging of the pelvis including uterus, ovaries, adnexal
regions, and pelvic cul-de-sac. It was necessary to proceed with
endovaginal exam following the transabdominal exam to visualize the
uterus, endometrium, ovaries and adnexa .

[Series 1: us pelvis complete · 0.25mm/px · 14 of 71 slices shown]
[im 1/71]
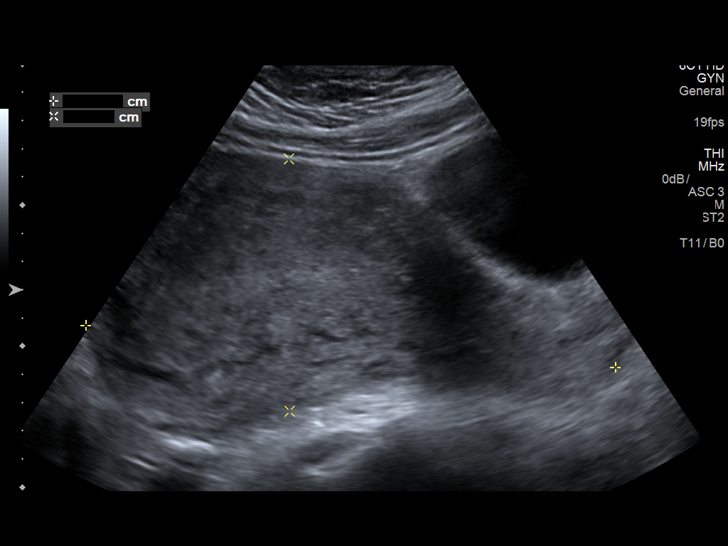
[im 6/71]
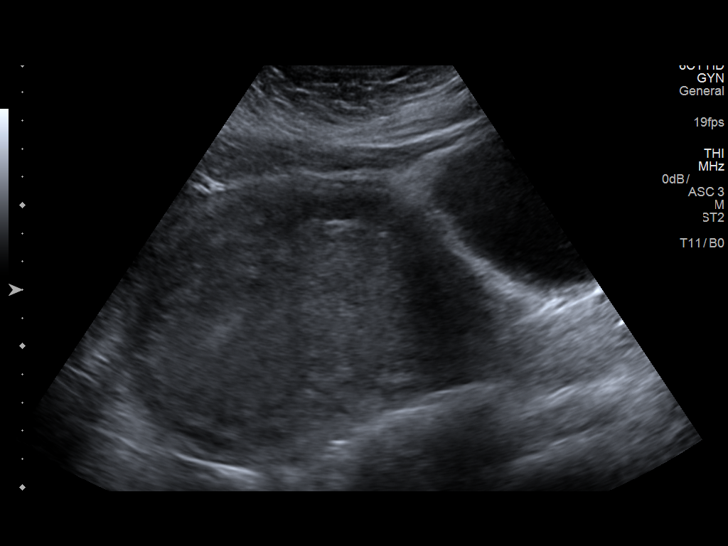
[im 12/71]
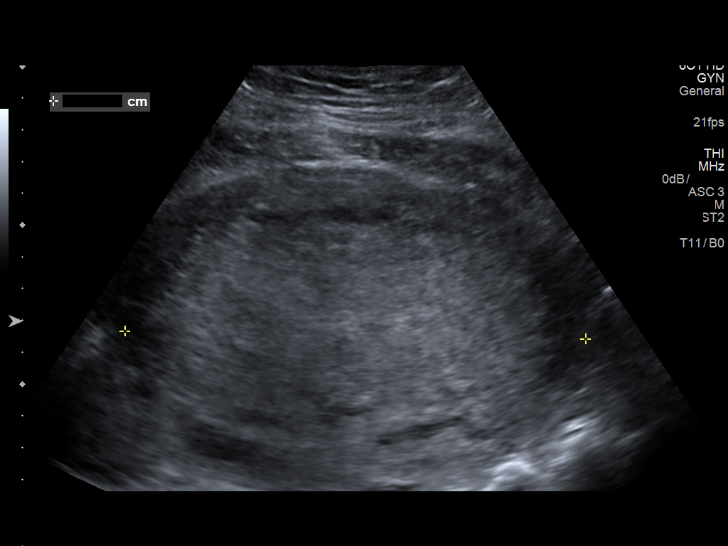
[im 18/71]
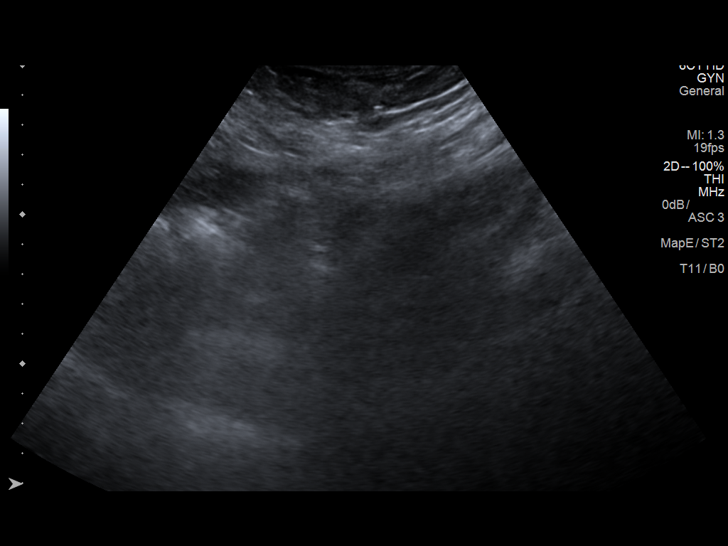
[im 24/71]
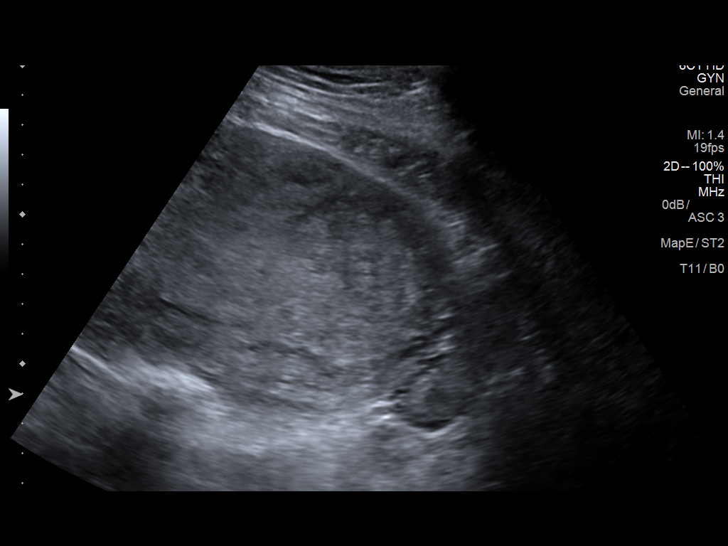
[im 27/71]
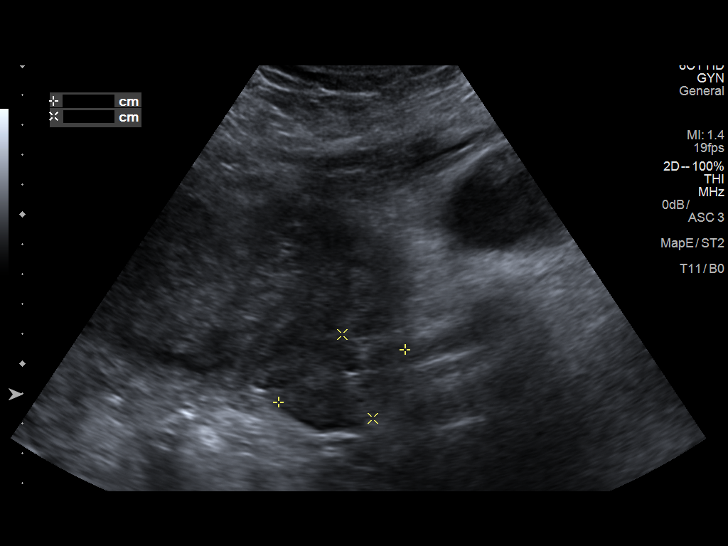
[im 33/71]
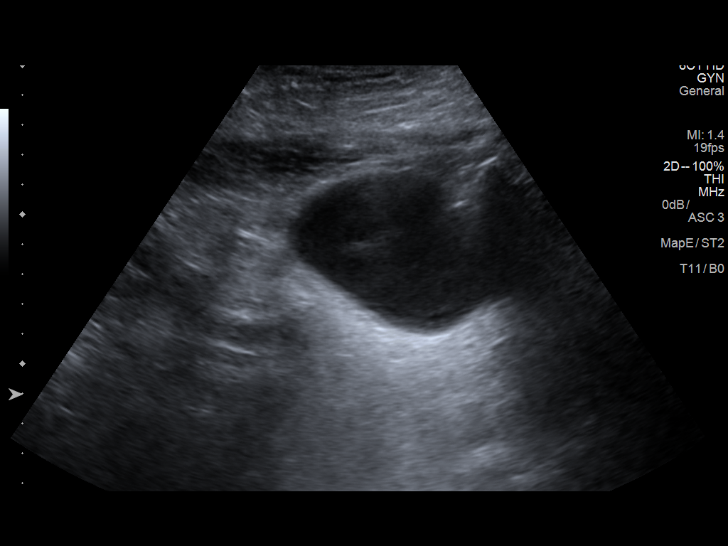
[im 38/71]
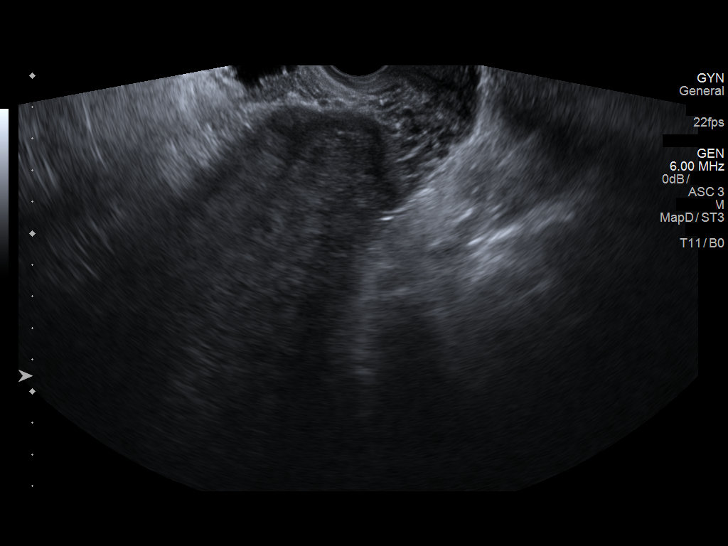
[im 44/71]
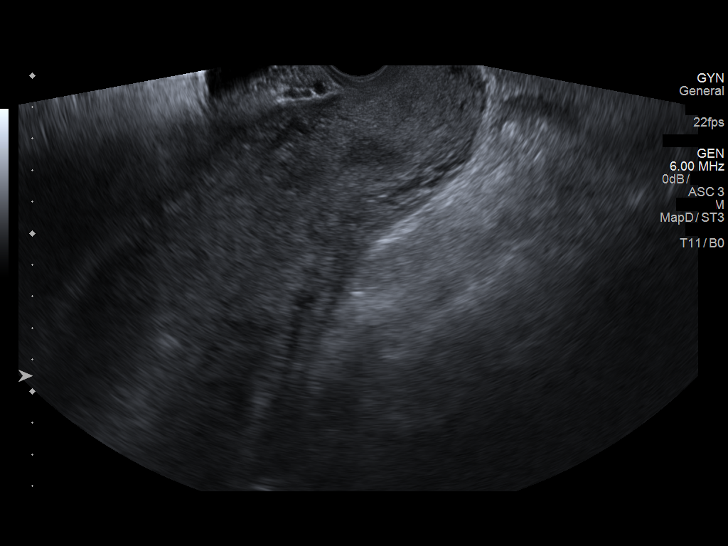
[im 47/71]
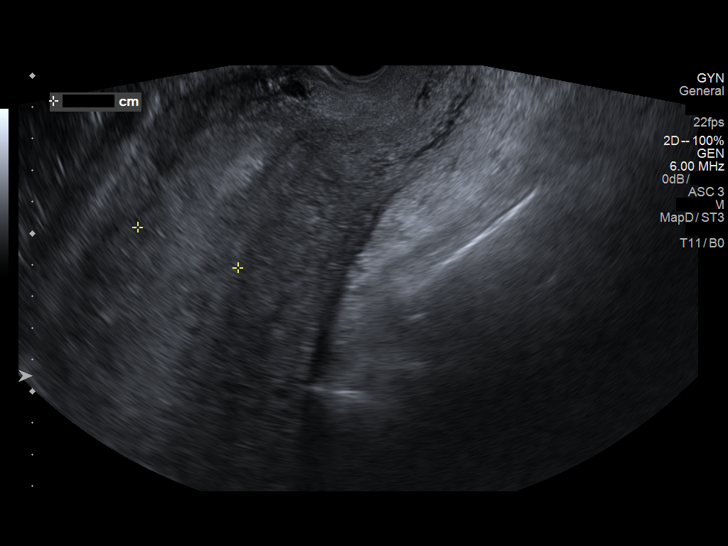
[im 53/71]
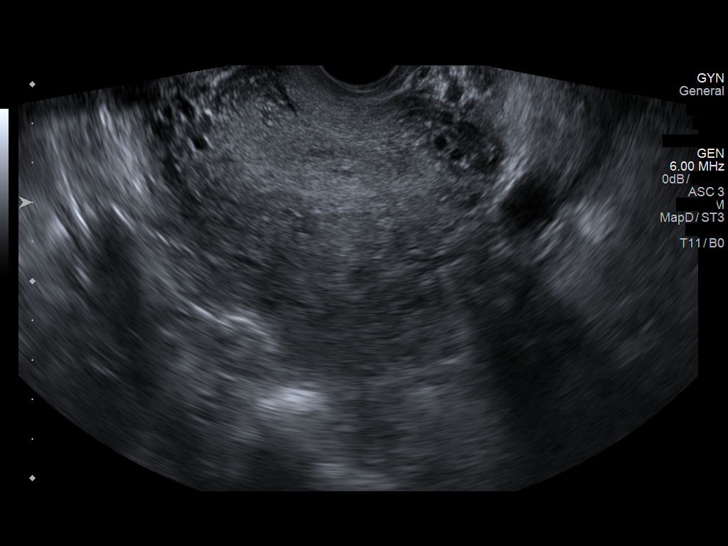
[im 59/71]
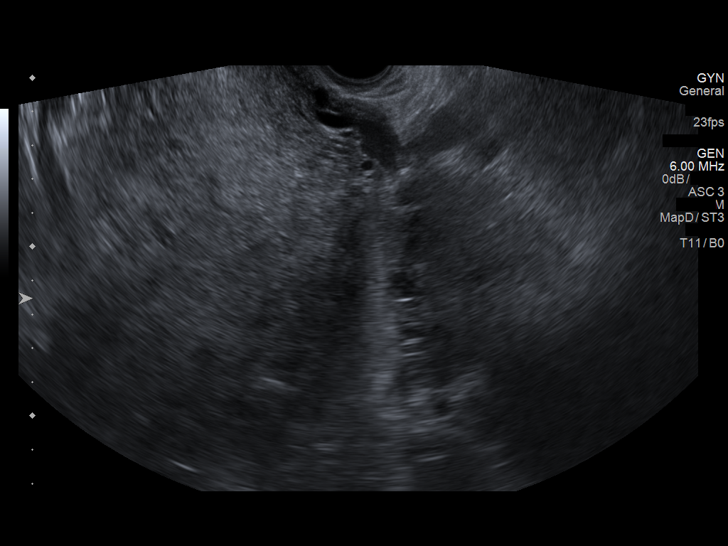
[im 65/71]
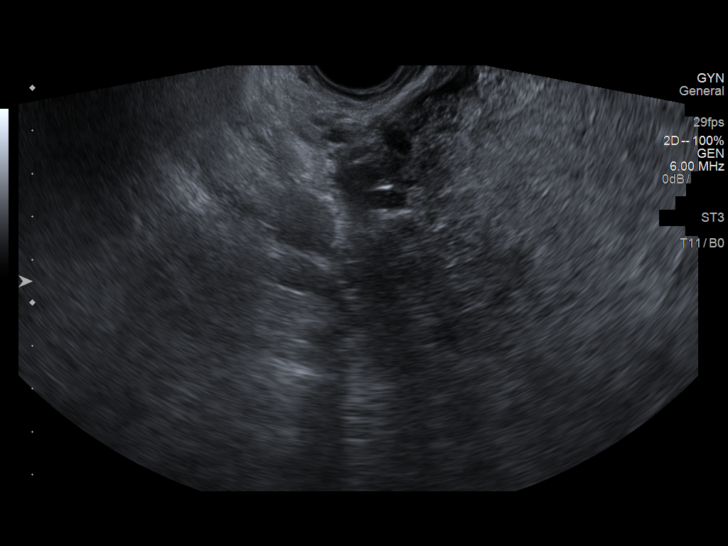
[im 71/71]
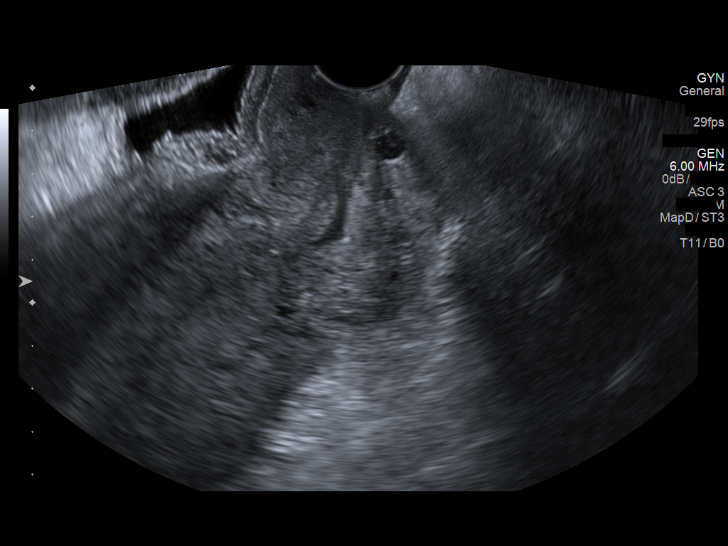

[14 of 25 positions shown; findings below may reference images not displayed]

FINDINGS: Uterus

Measurements: 18.8 x 9.0 x 2.1 cm. No fibroids or other mass
visualized.

Endometrium

Thickness: Markedly thickened at 4.7 cm.

Right ovary

Measurements: Not visualize. No adnexal mass seen.

Left ovary

Measurements: 4.6 x 3.0 x 4.6 cm. Normal appearance/no adnexal mass.

Other findings

No abnormal free fluid.
IMPRESSION: Enlarged postpartum uterus. Marked thickening of the endometrium,
measuring up to 4.7 cm. This could reflect postpartum hemorrhage or
retained products of conception.

## 2017-03-22 ENCOUNTER — Inpatient Hospital Stay (HOSPITAL_COMMUNITY)
Admission: AD | Admit: 2017-03-22 | Discharge: 2017-03-22 | Disposition: A | Discharge status: Home / Self Care | Payer: Medicaid Other | Source: Ambulatory Visit | Attending: Obstetrics and Gynecology | Admitting: Obstetrics and Gynecology

## 2017-03-22 ENCOUNTER — Encounter (HOSPITAL_COMMUNITY): Payer: Self-pay

## 2017-03-22 DIAGNOSIS — R0602 Shortness of breath: Secondary | ICD-10-CM | POA: Insufficient documentation

## 2017-03-22 DIAGNOSIS — Z3A30 30 weeks gestation of pregnancy: Secondary | ICD-10-CM | POA: Insufficient documentation

## 2017-03-22 DIAGNOSIS — O26893 Other specified pregnancy related conditions, third trimester: Secondary | ICD-10-CM

## 2017-03-22 LAB — URINALYSIS, ROUTINE W REFLEX MICROSCOPIC
Bilirubin Urine: NEGATIVE
GLUCOSE, UA: NEGATIVE mg/dL
Hgb urine dipstick: NEGATIVE
KETONES UR: NEGATIVE mg/dL
LEUKOCYTES UA: NEGATIVE
Nitrite: NEGATIVE
PROTEIN: NEGATIVE mg/dL
Specific Gravity, Urine: 1.027 (ref 1.005–1.030)
pH: 6 (ref 5.0–8.0)

## 2017-03-22 LAB — CBC
HEMATOCRIT: 34.8 % — AB (ref 36.0–46.0)
HEMOGLOBIN: 12 g/dL (ref 12.0–15.0)
MCH: 29.3 pg (ref 26.0–34.0)
MCHC: 34.5 g/dL (ref 30.0–36.0)
MCV: 84.9 fL (ref 78.0–100.0)
Platelets: 292 10*3/uL (ref 150–400)
RBC: 4.1 MIL/uL (ref 3.87–5.11)
RDW: 14.6 % (ref 11.5–15.5)
WBC: 11.1 10*3/uL — ABNORMAL HIGH (ref 4.0–10.5)

## 2017-03-22 NOTE — Discharge Instructions (Signed)
Shortness of Breath, Adult Shortness of breath is when a person has trouble breathing enough air, or when a person feels like she or he is having trouble breathing in enough air. Shortness of breath could be a sign of medical problem. Follow these instructions at home: Pay attention to any changes in your symptoms. Take these actions to help with your condition:  Do not smoke. Smoking is a common cause of shortness of breath. If you smoke and you need help quitting, ask your health care provider.  Avoid things that can irritate your airways, such as: ? Mold. ? Dust. ? Air pollution. ? Chemical fumes. ? Things that can cause allergy symptoms (allergens), if you have allergies.  Keep your living space clean and free of mold and dust.  Rest as needed. Slowly return to your usual activities.  Take over-the-counter and prescription medicines, including oxygen and inhaled medicines, only as told by your health care provider.  Keep all follow-up visits as told by your health care provider. This is important.  Contact a health care provider if:  Your condition does not improve as soon as expected.  You have a hard time doing your normal activities, even after you rest.  You have new symptoms. Get help right away if:  Your shortness of breath gets worse.  You have shortness of breath when you are resting.  You feel light-headed or you faint.  You have a cough that is not controlled with medicines.  You cough up blood.  You have pain with breathing.  You have pain in your chest, arms, shoulders, or abdomen.  You have a fever.  You cannot walk up stairs or exercise the way that you normally do. This information is not intended to replace advice given to you by your health care provider. Make sure you discuss any questions you have with your health care provider. Document Released: 10/25/2000 Document Revised: 08/21/2015 Document Reviewed: 07/08/2015 [employer]vier Interactive Patient  Education  2018 [employer]  

## 2017-03-22 NOTE — MAU Provider Note (Signed)
History     CSN: [id number]  Arrival date and time: 03/22/17 1357   First Provider Initiated Contact with Patient 03/22/17 1447      Chief Complaint  Patient presents with  . Shortness of Breath  . Headache   HPI Sierra Jackson is a 37 y.o. G4P3003 at 1874w2d who presents with SOB. Reports gradual onset of symptoms since Tuesday. Symptoms are intermittent. States has trouble taking deep breaths while sitting down at work. When this occurs, she feels like she breathes faster then has a headache and flashes in vision. Describes that she feels like she has in previous pregnancies at 40 wks but it's a lot earlier this time. Feels like she doesn't have enough room to breathe because of her abdomen. Denies abdominal pain, chest pain, or current headache. Had headache this morning while at work that resolved without intervention. Denies hx of hypertension.  Positive fetal movement.   OB History    Gravida Para Term Preterm AB Living   4 3 3  0 0 3   SAB TAB Ectopic Multiple Live Births   0 0 0 0 3      Past Medical History:  Diagnosis Date  . ADHD (attention deficit hyperactivity disorder)   . GERD (gastroesophageal reflux disease)    during pregnancy only, states was not bad enough to have to take medication  . PCOS (polycystic ovarian syndrome)   . Vaginal Pap smear, abnormal 1992, 1993   abnormal pap on two separate occasions, with biopsy done, which were negative    Past Surgical History:  Procedure Laterality Date  . ADENOIDECTOMY  1985  . WISDOM TOOTH EXTRACTION      History reviewed. No pertinent family history.  Social History   Tobacco Use  . Smoking status: Never Smoker  . Smokeless tobacco: Never Used  Substance Use Topics  . Alcohol use: No  . Drug use: No    Allergies: No Known Allergies  Medications Prior to Admission  Medication Sig Dispense Refill Last Dose  . amoxicillin (AMOXIL) 500 MG capsule Take 1 capsule by mouth daily.   03/22/2017 at Unknown  time  . Prenat-FeFum-FePo-FA-Omega 3 (CONCEPT DHA) 53.5-38-1 MG CAPS Take 1 capsule by mouth daily.  0 03/22/2017 at Unknown time    Review of Systems  Constitutional: Negative.   Eyes: Positive for visual disturbance (not currently).  Respiratory: Positive for shortness of breath. Negative for cough, chest tightness and wheezing.   Cardiovascular: Negative.   Gastrointestinal: Negative.   Genitourinary: Negative.   Neurological: Positive for headaches (not currently).   Physical Exam   Blood pressure 115/79, pulse 88, temperature (!) 97.3 F (36.3 C), temperature source Oral, resp. rate 16, weight 265 lb (120.2 kg), SpO2 98 %, unknown if currently breastfeeding.  Physical Exam  Nursing note and vitals reviewed. Constitutional: She is oriented to person, place, and time. She appears well-developed and well-nourished. No distress.  HENT:  Head: Normocephalic and atraumatic.  Eyes: Conjunctivae are normal. Right eye exhibits no discharge. Left eye exhibits no discharge. No scleral icterus.  Neck: Normal range of motion.  Cardiovascular: Normal rate, regular rhythm and normal heart sounds.  No murmur heard. Respiratory: Effort normal and breath sounds normal. No respiratory distress. She has no wheezes.  Neurological: She is alert and oriented to person, place, and time.  Skin: Skin is warm and dry. She is not diaphoretic.  Psychiatric: She has a normal mood and affect. Her behavior is normal. Judgment and thought content normal.  MAU Course  Procedures Results for orders placed or performed during the hospital encounter of 03/22/17 (from the past 24 hour(s))  Urinalysis, Routine w reflex microscopic     Status: None   Collection Time: 03/22/17  2:05 PM  Result Value Ref Range   Color, Urine YELLOW YELLOW   APPearance CLEAR CLEAR   Specific Gravity, Urine 1.027 1.005 - 1.030   pH 6.0 5.0 - 8.0   Glucose, UA NEGATIVE NEGATIVE mg/dL   Hgb urine dipstick NEGATIVE NEGATIVE    Bilirubin Urine NEGATIVE NEGATIVE   Ketones, ur NEGATIVE NEGATIVE mg/dL   Protein, ur NEGATIVE NEGATIVE mg/dL   Nitrite NEGATIVE NEGATIVE   Leukocytes, UA NEGATIVE NEGATIVE  CBC     Status: Abnormal   Collection Time: 03/22/17  3:05 PM  Result Value Ref Range   WBC 11.1 (H) 4.0 - 10.5 K/uL   RBC 4.10 3.87 - 5.11 MIL/uL   Hemoglobin 12.0 12.0 - 15.0 g/dL   HCT 16.1 (L) 09.6 - 04.5 %   MCV 84.9 78.0 - 100.0 fL   MCH 29.3 26.0 - 34.0 pg   MCHC 34.5 30.0 - 36.0 g/dL   RDW 40.9 81.1 - 91.4 %   Platelets 292 150 - 400 K/uL    MDM NST:  Baseline: 140 bpm, Variability: Good {> 6 bpm), Accelerations: Reactive and Decelerations: Absent VSS. Pt normotensive. SpO2 95-98% EKG CBC Pt reports improvement in symptoms. Reassured regarding symptoms.  C/w Dr. Jackelyn Knife. Ok to discharge home.   Assessment and Plan  A: 1. Shortness of breath due to pregnancy in third trimester   2. [redacted] weeks gestation of pregnancy    P: Discharge home Discussed reasons to return to MAU Keep f/u with OB  Judeth Horn 03/22/2017, 2:47 PM

## 2017-03-22 NOTE — MAU Note (Signed)
Pt states she can't catch her breath and is having headaches and "seeing stars". States this has been going on since Tuesday. No LOF or bleeding, + FM

## 2017-04-22 ENCOUNTER — Emergency Department (HOSPITAL_COMMUNITY)
Admission: EM | Admit: 2017-04-22 | Discharge: 2017-04-22 | Disposition: A | Discharge status: Home / Self Care | Payer: Managed Care, Other (non HMO) | Attending: Emergency Medicine | Admitting: Emergency Medicine

## 2017-04-22 ENCOUNTER — Other Ambulatory Visit: Payer: Self-pay

## 2017-04-22 ENCOUNTER — Encounter (HOSPITAL_COMMUNITY): Payer: Self-pay

## 2017-04-22 DIAGNOSIS — K047 Periapical abscess without sinus: Secondary | ICD-10-CM | POA: Diagnosis not present

## 2017-04-22 DIAGNOSIS — K0889 Other specified disorders of teeth and supporting structures: Secondary | ICD-10-CM | POA: Diagnosis present

## 2017-04-22 MED ORDER — AMOXICILLIN 500 MG PO CAPS: 500.0000 mg | ORAL_CAPSULE | Freq: Every day | ORAL | 0 refills | Status: DC

## 2017-04-22 NOTE — Discharge Instructions (Signed)
Return here as needed. Follow up with your dentist.  °

## 2017-04-22 NOTE — ED Triage Notes (Signed)
Pt arrives to Ed with dental pain; pt stats she need antibiotic til she is able to get root canal  On Thursday; pt is currently [redacted] weeks pregnant; Pt states pain at 6/10 on arrival-Monique,RN

## 2017-04-22 NOTE — ED Notes (Signed)
Pt ambulated to hallway bed from waiting room, tolerated well.

## 2017-04-22 NOTE — ED Provider Notes (Signed)
[facility]   CSN: [id number] Arrival date & time: 04/22/17  [id number]     History   Chief Complaint Chief Complaint  Patient presents with  . Dental Pain    HPI Sierra Jackson is a 37 y.o. female.  HPI Patient presents to the emergency department with right lower dental pain this been ongoing over the last week.  The patient states she is due to have a root canal on Thursday but states that she is having significant discomfort.  She states she needs more antibiotics until that time.  Patient states she has been taking Tylenol for pain.  She states nothing seems to make the condition better.  She states that palpation of the area makes the pain worse.  She states that she has had no nausea, vomiting, diarrhea, shortness of breath, throat swelling, tongue swelling, neck swelling, chest pain, back pain or fever. Past Medical History:  Diagnosis Date  . ADHD (attention deficit hyperactivity disorder)   . GERD (gastroesophageal reflux disease)    during pregnancy only, states was not bad enough to have to take medication  . PCOS (polycystic ovarian syndrome)   . Vaginal Pap smear, abnormal 1992, 1993   abnormal pap on two separate occasions, with biopsy done, which were negative    Patient Active Problem List   Diagnosis Date Noted  . Postpartum endometritis 07/06/2015  . Pregnancy 06/29/2015  . Postpartum state 06/29/2015    Past Surgical History:  Procedure Laterality Date  . ADENOIDECTOMY  1985  . WISDOM TOOTH EXTRACTION      OB History    Gravida Para Term Preterm AB Living   4 3 3  0 0 3   SAB TAB Ectopic Multiple Live Births   0 0 0 0 3       Home Medications    Prior to Admission medications   Medication Sig Start Date End Date Taking? Authorizing Provider  amoxicillin (AMOXIL) 500 MG capsule Take 1 capsule (500 mg total) by mouth daily. 04/22/17   Arkel Cartwright, Cristal Deerhristopher, PA-C  Prenat-FeFum-FePo-FA-Omega 3  (CONCEPT DHA) 53.5-38-1 MG CAPS Take 1 capsule by mouth daily. 07/05/15   [provider]    Family History No family history on file.  Social History Social History   Tobacco Use  . Smoking status: Never Smoker  . Smokeless tobacco: Never Used  Substance Use Topics  . Alcohol use: No  . Drug use: No     Allergies   Patient has no known allergies.   Review of Systems Review of Systems All other systems negative except as documented in the HPI. All pertinent positives and negatives as reviewed in the HPI.  Physical Exam Updated Vital Signs BP 122/81 (BP Location: Right Arm)   Pulse 85   Temp 97.7 F (36.5 C) (Oral)   Resp 17   SpO2 98%   Physical Exam  Constitutional: She is oriented to person, place, and time. She appears well-developed and well-nourished. No distress.  HENT:  Head: Normocephalic and atraumatic.  Mouth/Throat: Oropharynx is clear and moist. No trismus in the jaw. Abnormal dentition. Dental caries present. No uvula swelling.  Eyes: Pupils are equal, round, and reactive to light.  Pulmonary/Chest: Effort normal.  Neurological: She is alert and oriented to person, place, and time.  Skin: Skin is warm and dry.  Psychiatric: She has a normal mood and affect.  Nursing note and vitals reviewed.    ED Treatments / Results  Labs (  all labs ordered are listed, but only abnormal results are displayed) Labs Reviewed - No data to display  EKG  EKG Interpretation None       Radiology No results found.  Procedures Procedures (including critical care time)  Medications Ordered in ED Medications - No data to display   Initial Impression / Assessment and Plan / ED Course  I have reviewed the triage vital signs and the nursing notes.  Pertinent labs & imaging results that were available during my care of the patient were reviewed by me and considered in my medical decision making (see chart for details).     Patient has no  identifiable abscess on physical examination of the mouth.  She does have swelling along the jawline.  There is no airway compromise and the swelling under the tongue.  I do not feel she has any Ludwig's angina.  Patient is advised to return here as needed. Final Clinical Impressions(s) / ED Diagnoses   Final diagnoses:  Dental infection    ED Discharge Orders        Ordered    amoxicillin (AMOXIL) 500 MG capsule  Daily     04/22/17 0824       Charlestine Night, PA-C 04/26/17 0109    Terrilee Files, MD 04/26/17 [id number]

## 2017-04-30 LAB — OB RESULTS CONSOLE GBS: GBS: NEGATIVE

## 2017-06-04 ENCOUNTER — Inpatient Hospital Stay (HOSPITAL_COMMUNITY)
Admission: AD | Admit: 2017-06-04 | Discharge: 2017-06-04 | Disposition: A | Discharge status: Home / Self Care | Payer: Managed Care, Other (non HMO) | Source: Ambulatory Visit | Attending: Obstetrics and Gynecology | Admitting: Obstetrics and Gynecology

## 2017-06-04 ENCOUNTER — Encounter (HOSPITAL_COMMUNITY): Payer: Self-pay | Admitting: *Deleted

## 2017-06-04 DIAGNOSIS — O471 False labor at or after 37 completed weeks of gestation: Secondary | ICD-10-CM

## 2017-06-04 NOTE — Discharge Instructions (Signed)
Braxton Hicks Contractions °Contractions of the uterus can occur throughout pregnancy, but they are not always a sign that you are in labor. You may have practice contractions called Braxton Hicks contractions. These false labor contractions are sometimes confused with true labor. °What are Braxton Hicks contractions? °Braxton Hicks contractions are tightening movements that occur in the muscles of the uterus before labor. Unlike true labor contractions, these contractions do not result in opening (dilation) and thinning of the cervix. Toward the end of pregnancy (32-34 weeks), Braxton Hicks contractions can happen more often and may become stronger. These contractions are sometimes difficult to tell apart from true labor because they can be very uncomfortable. You should not feel embarrassed if you go to the hospital with false labor. °Sometimes, the only way to tell if you are in true labor is for your health care provider to look for changes in the cervix. The health care provider will do a physical exam and may monitor your contractions. If you are not in true labor, the exam should show that your cervix is not dilating and your water has not broken. °If there are other health problems associated with your pregnancy, it is completely safe for you to be sent home with false labor. You may continue to have Braxton Hicks contractions until you go into true labor. °How to tell the difference between true labor and false labor °True labor °· Contractions last 30-70 seconds. °· Contractions become very regular. °· Discomfort is usually felt in the top of the uterus, and it spreads to the lower abdomen and low back. °· Contractions do not go away with walking. °· Contractions usually become more intense and increase in frequency. °· The cervix dilates and gets thinner. °False labor °· Contractions are usually shorter and not as strong as true labor contractions. °· Contractions are usually irregular. °· Contractions  are often felt in the front of the lower abdomen and in the groin. °· Contractions may go away when you walk around or change positions while lying down. °· Contractions get weaker and are shorter-lasting as time goes on. °· The cervix usually does not dilate or become thin. °Follow these instructions at home: °· Take over-the-counter and prescription medicines only as told by your health care provider. °· Keep up with your usual exercises and follow other instructions from your health care provider. °· Eat and drink lightly if you think you are going into labor. °· If Braxton Hicks contractions are making you uncomfortable: °? Change your position from lying down or resting to walking, or change from walking to resting. °? Sit and rest in a tub of warm water. °? Drink enough fluid to keep your urine pale yellow. Dehydration may cause these contractions. °? Do slow and deep breathing several times an hour. °· Keep all follow-up prenatal visits as told by your health care provider. This is important. °Contact a health care provider if: °· You have a fever. °· You have continuous pain in your abdomen. °Get help right away if: °· Your contractions become stronger, more regular, and closer together. °· You have fluid leaking or gushing from your vagina. °· You pass blood-tinged mucus (bloody show). °· You have bleeding from your vagina. °· You have low back pain that you never had before. °· You feel your baby’s head pushing down and causing pelvic pressure. °· Your baby is not moving inside you as much as it used to. °Summary °· Contractions that occur before labor are called Braxton   Hicks contractions, false labor, or practice contractions. °· Braxton Hicks contractions are usually shorter, weaker, farther apart, and less regular than true labor contractions. True labor contractions usually become progressively stronger and regular and they become more frequent. °· Manage discomfort from Braxton Hicks contractions by  changing position, resting in a warm bath, drinking plenty of water, or practicing deep breathing. °This information is not intended to replace advice given to you by your health care provider. Make sure you discuss any questions you have with your health care provider. °Document Released: 06/15/2016 Document Revised: 06/15/2016 Document Reviewed: 06/15/2016 °Elsevier Interactive Patient Education © 2018 [employer] ° °Fetal Movement Counts °Patient Name: ________________________________________________ Patient Due Date: ____________________ °What is a fetal movement count? °A fetal movement count is the number of times that you feel your baby move during a certain amount of time. This may also be called a fetal kick count. A fetal movement count is recommended for every pregnant woman. You may be asked to start counting fetal movements as early as week 28 of your pregnancy. °Pay attention to when your baby is most active. You may notice your baby's sleep and wake cycles. You may also notice things that make your baby move more. You should do a fetal movement count: °· When your baby is normally most active. °· At the same time each day. ° °A good time to count movements is while you are resting, after having something to eat and drink. °How do I count fetal movements? °1. Find a quiet, comfortable area. Sit, or lie down on your side. °2. Write down the date, the start time and stop time, and the number of movements that you felt between those two times. Take this information with you to your health care visits. °3. For 2 hours, count kicks, flutters, swishes, rolls, and jabs. You should feel at least 10 movements during 2 hours. °4. You may stop counting after you have felt 10 movements. °5. If you do not feel 10 movements in 2 hours, have something to eat and drink. Then, keep resting and counting for 1 hour. If you feel at least 4 movements during that hour, you may stop counting. °Contact a health care  provider if: °· You feel fewer than 4 movements in 2 hours. °· Your baby is not moving like he or she usually does. °Date: ____________ Start time: ____________ Stop time: ____________ Movements: ____________ °Date: ____________ Start time: ____________ Stop time: ____________ Movements: ____________ °Date: ____________ Start time: ____________ Stop time: ____________ Movements: ____________ °Date: ____________ Start time: ____________ Stop time: ____________ Movements: ____________ °Date: ____________ Start time: ____________ Stop time: ____________ Movements: ____________ °Date: ____________ Start time: ____________ Stop time: ____________ Movements: ____________ °Date: ____________ Start time: ____________ Stop time: ____________ Movements: ____________ °Date: ____________ Start time: ____________ Stop time: ____________ Movements: ____________ °Date: ____________ Start time: ____________ Stop time: ____________ Movements: ____________ °This information is not intended to replace advice given to you by your health care provider. Make sure you discuss any questions you have with your health care provider. °Document Released: 03/01/2006 Document Revised: 09/29/2015 Document Reviewed: 03/11/2015 °Elsevier Interactive Patient Education © 2018 [employer] ° °

## 2017-06-04 NOTE — MAU Note (Signed)
Pt reports contractions, denies bleeding or ROM.  

## 2017-06-04 NOTE — Progress Notes (Signed)
FHT reviewed.  Reactive NST, irregular ctx.

## 2017-06-05 ENCOUNTER — Encounter (HOSPITAL_COMMUNITY): Payer: Self-pay

## 2017-06-05 ENCOUNTER — Inpatient Hospital Stay (HOSPITAL_COMMUNITY): Payer: Managed Care, Other (non HMO) | Admitting: Anesthesiology

## 2017-06-05 ENCOUNTER — Inpatient Hospital Stay (HOSPITAL_COMMUNITY)
Admission: AD | Admit: 2017-06-05 | Discharge: 2017-06-07 | DRG: 807 | Disposition: A | Discharge status: Home / Self Care | Payer: Managed Care, Other (non HMO) | Source: Ambulatory Visit | Attending: Obstetrics and Gynecology | Admitting: Obstetrics and Gynecology

## 2017-06-05 DIAGNOSIS — O480 Post-term pregnancy: Principal | ICD-10-CM | POA: Diagnosis present

## 2017-06-05 DIAGNOSIS — Z3A40 40 weeks gestation of pregnancy: Secondary | ICD-10-CM

## 2017-06-05 DIAGNOSIS — Z3490 Encounter for supervision of normal pregnancy, unspecified, unspecified trimester: Secondary | ICD-10-CM

## 2017-06-05 DIAGNOSIS — O99214 Obesity complicating childbirth: Secondary | ICD-10-CM | POA: Diagnosis present

## 2017-06-05 HISTORY — DX: Encounter for supervision of normal pregnancy, unspecified, unspecified trimester: Z34.90

## 2017-06-05 LAB — CBC WITH DIFFERENTIAL/PLATELET
BASOS PCT: 0 %
Basophils Absolute: 0 10*3/uL (ref 0.0–0.1)
Eosinophils Absolute: 0.1 10*3/uL (ref 0.0–0.7)
Eosinophils Relative: 1 %
HEMATOCRIT: 36.6 % (ref 36.0–46.0)
Hemoglobin: 12.2 g/dL (ref 12.0–15.0)
Lymphocytes Relative: 24 %
Lymphs Abs: 2.2 10*3/uL (ref 0.7–4.0)
MCH: 28 pg (ref 26.0–34.0)
MCHC: 33.3 g/dL (ref 30.0–36.0)
MCV: 84.1 fL (ref 78.0–100.0)
MONO ABS: 0.5 10*3/uL (ref 0.1–1.0)
MONOS PCT: 6 %
NEUTROS ABS: 6.2 10*3/uL (ref 1.7–7.7)
Neutrophils Relative %: 69 %
Platelets: 362 10*3/uL (ref 150–400)
RBC: 4.35 MIL/uL (ref 3.87–5.11)
RDW: 15 % (ref 11.5–15.5)
WBC: 9 10*3/uL (ref 4.0–10.5)

## 2017-06-05 LAB — TYPE AND SCREEN
ABO/RH(D): A POS
ANTIBODY SCREEN: NEGATIVE

## 2017-06-05 MED ORDER — BENZOCAINE-MENTHOL 20-0.5 % EX AERO: 1.0000 "application " | INHALATION_SPRAY | CUTANEOUS | Status: DC | PRN

## 2017-06-05 MED ORDER — LACTATED RINGERS IV SOLN
500.0000 mL | Freq: Once | INTRAVENOUS | Status: AC
  Administered 2017-06-05: 500 mL via INTRAVENOUS

## 2017-06-05 MED ORDER — DIPHENHYDRAMINE HCL 50 MG/ML IJ SOLN: 12.5000 mg | INTRAMUSCULAR | Status: DC | PRN

## 2017-06-05 MED ORDER — TERBUTALINE SULFATE 1 MG/ML IJ SOLN
0.2500 mg | Freq: Once | INTRAMUSCULAR | Status: DC | PRN
Start: 2017-06-05 — End: 2017-06-05

## 2017-06-05 MED ORDER — ONDANSETRON HCL 4 MG/2ML IJ SOLN: 4.0000 mg | INTRAMUSCULAR | Status: DC | PRN

## 2017-06-05 MED ORDER — OXYCODONE HCL 5 MG PO TABS: 10.0000 mg | ORAL_TABLET | ORAL | Status: DC | PRN

## 2017-06-05 MED ORDER — ZOLPIDEM TARTRATE 5 MG PO TABS: 5.0000 mg | ORAL_TABLET | Freq: Every evening | ORAL | Status: DC | PRN

## 2017-06-05 MED ORDER — LACTATED RINGERS IV SOLN
INTRAVENOUS | Status: DC
  Administered 2017-06-05 (×2): via INTRAVENOUS

## 2017-06-05 MED ORDER — COCONUT OIL OIL: 1.0000 "application " | TOPICAL_OIL | Status: DC | PRN

## 2017-06-05 MED ORDER — WITCH HAZEL-GLYCERIN EX PADS: 1.0000 "application " | MEDICATED_PAD | CUTANEOUS | Status: DC | PRN

## 2017-06-05 MED ORDER — DIPHENHYDRAMINE HCL 25 MG PO CAPS: 25.0000 mg | ORAL_CAPSULE | Freq: Four times a day (QID) | ORAL | Status: DC | PRN

## 2017-06-05 MED ORDER — EPHEDRINE 5 MG/ML INJ: 10.0000 mg | INTRAVENOUS | Status: DC | PRN

## 2017-06-05 MED ORDER — FENTANYL 2.5 MCG/ML BUPIVACAINE 1/10 % EPIDURAL INFUSION (WH - ANES)
14.0000 mL/h | INTRAMUSCULAR | Status: DC | PRN
  Administered 2017-06-05: 14 mL/h via EPIDURAL
  Filled 2017-06-05: qty 100

## 2017-06-05 MED ORDER — IBUPROFEN 600 MG PO TABS
600.0000 mg | ORAL_TABLET | Freq: Four times a day (QID) | ORAL | Status: DC
  Administered 2017-06-05 – 2017-06-07 (×6): 600 mg via ORAL
  Filled 2017-06-05 (×6): qty 1

## 2017-06-05 MED ORDER — OXYTOCIN 40 UNITS IN LACTATED RINGERS INFUSION - SIMPLE MED
INTRAVENOUS | Status: AC
  Administered 2017-06-05: 500 mL/h
  Filled 2017-06-05: qty 1000

## 2017-06-05 MED ORDER — DIBUCAINE 1 % RE OINT: 1.0000 "application " | TOPICAL_OINTMENT | RECTAL | Status: DC | PRN

## 2017-06-05 MED ORDER — SIMETHICONE 80 MG PO CHEW: 80.0000 mg | CHEWABLE_TABLET | ORAL | Status: DC | PRN

## 2017-06-05 MED ORDER — LIDOCAINE HCL (PF) 1 % IJ SOLN
INTRAMUSCULAR | Status: DC | PRN
  Administered 2017-06-05 (×2): 5 mL

## 2017-06-05 MED ORDER — OXYCODONE HCL 5 MG PO TABS: 5.0000 mg | ORAL_TABLET | ORAL | Status: DC | PRN

## 2017-06-05 MED ORDER — TETANUS-DIPHTH-ACELL PERTUSSIS 5-2.5-18.5 LF-MCG/0.5 IM SUSP: 0.5000 mL | Freq: Once | INTRAMUSCULAR | Status: DC

## 2017-06-05 MED ORDER — OXYTOCIN 40 UNITS IN LACTATED RINGERS INFUSION - SIMPLE MED
1.0000 m[IU]/min | INTRAVENOUS | Status: DC
  Administered 2017-06-05: 6 m[IU]/min via INTRAVENOUS
  Administered 2017-06-05: 2 m[IU]/min via INTRAVENOUS
  Administered 2017-06-05: 8 m[IU]/min via INTRAVENOUS
  Administered 2017-06-05: 4 m[IU]/min via INTRAVENOUS
  Administered 2017-06-05: 41.667 m[IU]/min via INTRAVENOUS

## 2017-06-05 MED ORDER — PRENATAL MULTIVITAMIN CH
1.0000 | ORAL_TABLET | Freq: Every day | ORAL | Status: DC
  Administered 2017-06-06: 1 via ORAL
  Filled 2017-06-05: qty 1

## 2017-06-05 MED ORDER — SENNOSIDES-DOCUSATE SODIUM 8.6-50 MG PO TABS
2.0000 | ORAL_TABLET | ORAL | Status: DC
  Administered 2017-06-05: 2 via ORAL
  Filled 2017-06-05 (×2): qty 2

## 2017-06-05 MED ORDER — PHENYLEPHRINE 40 MCG/ML (10ML) SYRINGE FOR IV PUSH (FOR BLOOD PRESSURE SUPPORT)
80.0000 ug | PREFILLED_SYRINGE | INTRAVENOUS | Status: DC | PRN
  Filled 2017-06-05: qty 10

## 2017-06-05 MED ORDER — ACETAMINOPHEN 325 MG PO TABS: 650.0000 mg | ORAL_TABLET | ORAL | Status: DC | PRN

## 2017-06-05 MED ORDER — PHENYLEPHRINE 40 MCG/ML (10ML) SYRINGE FOR IV PUSH (FOR BLOOD PRESSURE SUPPORT): 80.0000 ug | PREFILLED_SYRINGE | INTRAVENOUS | Status: DC | PRN

## 2017-06-05 MED ORDER — ONDANSETRON HCL 4 MG PO TABS: 4.0000 mg | ORAL_TABLET | ORAL | Status: DC | PRN

## 2017-06-05 NOTE — Anesthesia Preprocedure Evaluation (Signed)
Anesthesia Evaluation  Patient identified by MRN, date of birth, ID band Patient awake    Reviewed: Allergy & Precautions, H&P , NPO status , Patient's Chart, lab work & pertinent test results  History of Anesthesia Complications Negative for: history of anesthetic complications  Airway Mallampati: II  TM Distance: >3 FB Neck ROM: full    Dental no notable dental hx. (+) Teeth Intact   Pulmonary neg pulmonary ROS,    Pulmonary exam normal breath sounds clear to auscultation       Cardiovascular negative cardio ROS Normal cardiovascular exam Rhythm:regular Rate:Normal     Neuro/Psych negative neurological ROS  negative psych ROS   GI/Hepatic negative GI ROS, Neg liver ROS,   Endo/Other  Morbid obesity  Renal/GU negative Renal ROS  negative genitourinary   Musculoskeletal   Abdominal   Peds  Hematology negative hematology ROS (+)   Anesthesia Other Findings   Reproductive/Obstetrics (+) Pregnancy                             Anesthesia Physical Anesthesia Plan  ASA: III  Anesthesia Plan: Epidural   Post-op Pain Management:    Induction:   PONV Risk Score and Plan:   Airway Management Planned:   Additional Equipment:   Intra-op Plan:   Post-operative Plan:   Informed Consent: I have reviewed the patients History and Physical, chart, labs and discussed the procedure including the risks, benefits and alternatives for the proposed anesthesia with the patient or authorized representative who has indicated his/her understanding and acceptance.       Plan Discussed with:   Anesthesia Plan Comments:         Anesthesia Quick Evaluation  

## 2017-06-05 NOTE — Anesthesia Procedure Notes (Signed)
Epidural Patient location during procedure: OB  Staffing Anesthesiologist: Hilari Wethington, MD Performed: anesthesiologist   Preanesthetic Checklist Completed: patient identified, site marked, surgical consent, pre-op evaluation, timeout performed, IV checked, risks and benefits discussed and monitors and equipment checked  Epidural Patient position: sitting Prep: DuraPrep Patient monitoring: heart rate, continuous pulse ox and blood pressure Approach: right paramedian Location: L3-L4 Injection technique: LOR saline  Needle:  Needle type: Tuohy  Needle gauge: 17 G Needle length: 9 cm and 9 Needle insertion depth: 8 cm Catheter type: closed end flexible Catheter size: 20 Guage Catheter at skin depth: 12 cm Test dose: negative  Assessment Events: blood not aspirated, injection not painful, no injection resistance, negative IV test and no paresthesia  Additional Notes Patient identified. Risks/Benefits/Options discussed with patient including but not limited to bleeding, infection, nerve damage, paralysis, failed block, incomplete pain control, headache, blood pressure changes, nausea, vomiting, reactions to medication both or allergic, itching and postpartum back pain. Confirmed with bedside nurse the patient's most recent platelet count. Confirmed with patient that they are not currently taking any anticoagulation, have any bleeding history or any family history of bleeding disorders. Patient expressed understanding and wished to proceed. All questions were answered. Sterile technique was used throughout the entire procedure. Please see nursing notes for vital signs. Test dose was given through epidural needle and negative prior to continuing to dose epidural or start infusion. Warning signs of high block given to the patient including shortness of breath, tingling/numbness in hands, complete motor block, or any concerning symptoms with instructions to call for help. Patient was given  instructions on fall risk and not to get out of bed. All questions and concerns addressed with instructions to call with any issues.     

## 2017-06-05 NOTE — H&P (Signed)
Ulyess MortCourtney A Georgi is a 37 y.o. [id number] female presenting at 6140 6/[redacted]wks gestation for induction of labor. Pt is dated per 14week US. She was late to care. She stopped taking adderall when found out ws pregnant. Pregnancy complicated by maternal obesity. She declines essential panel and first trimester screen. FOB with SS trait but pt neg. Desires BTL - signed consent at [redacted]weeks gestation. GBS negative  OB History    Gravida  4   Para  3   Term  3   Preterm  0   AB  0   Living  3     SAB  0   TAB  0   Ectopic  0   Multiple  0   Live Births  3          Past Medical History:  Diagnosis Date  . ADHD (attention deficit hyperactivity disorder)   . GERD (gastroesophageal reflux disease)    during pregnancy only, states was not bad enough to have to take medication  . PCOS (polycystic ovarian syndrome)   . Term pregnancy 06/05/2017  . Vaginal Pap smear, abnormal 1992, 1993   abnormal pap on two separate occasions, with biopsy done, which were negative   Past Surgical History:  Procedure Laterality Date  . ADENOIDECTOMY  1985  . WISDOM TOOTH EXTRACTION     Family History: family history is not on file. Social History:  reports that she has never smoked. She has never used smokeless tobacco. She reports that she does not drink alcohol or use drugs.     Maternal Diabetes: No Genetic Screening: Declined Maternal Ultrasounds/Referrals: Normal Fetal Ultrasounds or other Referrals:  None Maternal Substance Abuse:  No Significant Maternal Medications:  None Significant Maternal Lab Results:  Lab values include: Group B Strep negative Other Comments:  None  Review of Systems  Constitutional: Positive for malaise/fatigue. Negative for chills, fever and weight loss.  Eyes: Negative for blurred vision and double vision.  Respiratory: Negative for cough and shortness of breath.   Cardiovascular: Negative for chest pain.  Gastrointestinal: Negative for abdominal pain, heartburn,  nausea and vomiting.  Genitourinary: Negative for dysuria.  Musculoskeletal: Positive for back pain.  Skin: Negative for itching and rash.  Neurological: Negative for dizziness and headaches.  Endo/Heme/Allergies: Does not bruise/bleed easily.  Psychiatric/Behavioral: Negative for depression, hallucinations, substance abuse and suicidal ideas. The patient is not nervous/anxious.    Maternal Medical History:  Reason for admission: Nausea. Favorable cervix, postdates  Contractions: Onset was 1 week or more ago.   Frequency: irregular.   Perceived severity is mild.    Fetal activity: Perceived fetal activity is normal.   Last perceived fetal movement was within the past hour.    Prenatal complications: no prenatal complications Prenatal Complications - Diabetes: none.    Dilation: 4.5 Effacement (%): 70 Station: -2 Exam by:: Marvell FullerShannon Earl, RNC Blood pressure 118/71, pulse 74, temperature 98.1 F (36.7 C), temperature source Oral, resp. rate 18, height 5\' 6"  (1.676 m), weight 269 lb (122 kg), SpO2 100 %, unknown if currently breastfeeding. Maternal Exam:  Uterine Assessment: Contraction strength is mild.  Contraction frequency is rare.   Abdomen: Patient reports generalized tenderness.  Estimated fetal weight is AGA.   Fetal presentation: vertex  Introitus: Normal vulva. Vulva is negative for condylomata and lesion.  Normal vagina.  Vagina is negative for condylomata and discharge.  Pelvis: adequate for delivery.   Cervix: Cervix evaluated by digital exam.     Physical Exam  Constitutional: She is oriented to person, place, and time. She appears well-developed and well-nourished.  Neck: Normal range of motion.  Cardiovascular: Normal rate.  Respiratory: Effort normal.  GI: Soft. There is generalized tenderness.  Genitourinary: Vagina normal and uterus normal. Vulva exhibits no lesion. No vaginal discharge found.  Musculoskeletal: Normal range of motion. She exhibits  edema.  Neurological: She is alert and oriented to person, place, and time.  Skin: Skin is warm.  Psychiatric: She has a normal mood and affect. Her behavior is normal. Judgment and thought content normal.    Prenatal labs: ABO, Rh: --/--/A POS (04/23 1228) Antibody: NEG (04/23 1228) Rubella: Immune (11/05 0000) RPR: Nonreactive (11/05 0000)  HBsAg: Negative (11/05 0000)  HIV: Non-reactive (11/05 0000)  GBS: Negative (03/18 0000)   Assessment/Plan: [id number] at 40 6/[redacted]wks gestation - agreed to iol today Received pitocin per protocol ; now at S/p epidural  Anticipate svd   Janean Sark Banga 06/05/2017, 5:05 PM

## 2017-06-05 NOTE — Anesthesia Pain Management Evaluation Note (Signed)
  CRNA Pain Management Visit Note  Patient: Sierra Jackson, 37 y.o., female  "Hello I am a member of the anesthesia team at [facility]. We have an anesthesia team available at all times to provide care throughout the hospital, including epidural management and anesthesia for C-section. I don't know your plan for the delivery whether it a natural birth, water birth, IV sedation, nitrous supplementation, doula or epidural, but we want to meet your pain goals."   1.Was your pain managed to your expectations on prior hospitalizations?   Yes   2.What is your expectation for pain management during this hospitalization?     Epidural  3.How can we help you reach that goal? Epidural when ready.  Record the patient's initial score and the patient's pain goal.   Pain: 0  Pain Goal: 6 [facility] wants you to be able to say your pain was always managed very well.  Sierra Jackson 06/05/2017

## 2017-06-06 LAB — RPR: RPR: NONREACTIVE

## 2017-06-06 LAB — CBC
HEMATOCRIT: 35.4 % — AB (ref 36.0–46.0)
HEMOGLOBIN: 11.9 g/dL — AB (ref 12.0–15.0)
MCH: 27.9 pg (ref 26.0–34.0)
MCHC: 33.6 g/dL (ref 30.0–36.0)
MCV: 82.9 fL (ref 78.0–100.0)
Platelets: 320 10*3/uL (ref 150–400)
RBC: 4.27 MIL/uL (ref 3.87–5.11)
RDW: 14.9 % (ref 11.5–15.5)
WBC: 10 10*3/uL (ref 4.0–10.5)

## 2017-06-06 NOTE — Anesthesia Postprocedure Evaluation (Signed)
Anesthesia Post Note  Patient: Sierra Jackson  Procedure(s) Performed: AN AD HOC LABOR EPIDURAL     Patient location during evaluation: Mother Baby Anesthesia Type: Epidural Level of consciousness: awake and alert Pain management: pain level controlled Vital Signs Assessment: post-procedure vital signs reviewed and stable Respiratory status: spontaneous breathing Cardiovascular status: blood pressure returned to baseline Postop Assessment: no headache, no backache, epidural receding, adequate PO intake, no apparent nausea or vomiting and patient able to bend at knees Anesthetic complications: no    Last Vitals:  Vitals:   06/06/17 0105 06/06/17 0509  BP: 125/67 129/74  Pulse: 82 77  Resp: 17   Temp: 36.8 C   SpO2: 98%     Last Pain:  Vitals:   06/06/17 0105  TempSrc: Oral  PainSc: 0-No pain   Pain Goal:                 Cassidee Deats

## 2017-06-06 NOTE — Progress Notes (Signed)
CSW received consult for hx of PPD after 3rd child.  She had a 0 on the Edinburgh Postnatal Depression Scale.  CSW met with MOB and FOB in MOB's first floor room/142 to offer support and complete assessment.  Parents were very calm and quiet, but pleasant and welcoming.   MOB denies any hx of dep/anx both after her last 3 deliveries or in general.  She states she is feeling well now, that labor and delivery went "great" and that she has no emotional concerns.  MOB was engaged and attentive as CSW provided education regarding PMADs and agreed to notify a medical professional if concerns arise.  CSW identifies no further need for intervention and no barriers to discharge when MOB and baby are medically ready.

## 2017-06-06 NOTE — Lactation Note (Signed)
This note was copied from a baby's chart. Lactation Consultation Note  Patient Name: Sierra Jackson RUEAV'WToday's Date: 06/06/2017 Reason for consult: Initial assessment;Term Type of Endocrine Disorder?: PCOS with breast changes per mom  Baby is 22 hours old / post circ from this am . Per mom baby has been sleepy.  As LC entered the room, dad had recently changed a mec stool diaper and mom had  Spoon fed the baby 1 ml, baby tolerated well and more awake.  LC offered to assist mom with latch. LC provided extra pillows for support.  Baby opened wide and latched with FISh lisp , sluggish at 1st, and increased with breast  Compressions, swallows noted. Baby still feeding at 7 mins, mom comfortable at the latch, except  Cramping. LC recommended to try attempting emptying bladder prior to breast feeding and it should get better  By 72 hours.  Mother informed of post-discharge support and given phone number to the lactation department, including services for phone call assistance; out-patient appointments; and breastfeeding support group. List of other breastfeeding resources in the community given in the handout. Encouraged mother to call for problems or concerns related to breastfeeding.    Maternal Data Has patient been taught Hand Expression?: Yes Does the patient have breastfeeding experience prior to this delivery?: Yes  Feeding Feeding Type: Breast Fed Length of feed: (swallows noted, )  LATCH Score Latch: Grasps breast easily, tongue down, lips flanged, rhythmical sucking.  Audible Swallowing: A few with stimulation  Type of Nipple: Everted at rest and after stimulation  Comfort (Breast/Nipple): Soft / non-tender  Hold (Positioning): Assistance needed to correctly position infant at breast and maintain latch.  LATCH Score: 8  Interventions Interventions: Breast feeding basics reviewed;Assisted with latch;Skin to skin;Breast massage;Hand express;Breast compression;Adjust  position;Support pillows;Position options  Lactation Tools Discussed/Used Tools: Pump;Shells(provided bythe [facility] ) Shell Type: Inverted Breast pump type: Manual   Consult Status Consult Status: Follow-up Date: 06/07/17 Follow-up type: In-patient    Sierra Jackson 06/06/2017, 3:34 PM

## 2017-06-06 NOTE — Progress Notes (Signed)
   06/06/17 1804 06/06/17 1910  Vital Signs  BP (!) 135/95 (bp checked x2 ) 128/90 (Bovard MD notified, call back if >165/95)  BP Location Right Arm  --   Patient Position (if appropriate) Sitting  --   BP Method Automatic  --   Pulse Rate 70 76  Resp 18  --   Temp (!) 97.4 F (36.3 C) (pt stated she just ate ice cream)  --   Temp Source Oral  --

## 2017-06-06 NOTE — Progress Notes (Addendum)
Post Partum Day 1 Subjective: no complaints, voiding, tolerating PO and nl lochia, pain controlled  Objective: Blood pressure 129/74, pulse 77, temperature 98.2 F (36.8 C), temperature source Oral, resp. rate 17, height 5\' 6"  (1.676 m), weight 122 kg (269 lb), SpO2 98 %, unknown if currently breastfeeding.  Physical Exam:  General: alert and no distress Lochia: appropriate Uterine Fundus: firm  Recent Labs    06/05/17 1120 06/06/17 0610  HGB 12.2 11.9*  HCT 36.6 35.4*    Assessment/Plan: Plan for discharge tomorrow, Breastfeeding and Lactation consult.  Circumcision today.   Possible d/c later today   LOS: 1 day   Sierra Jackson 06/06/2017, 8:54 AM

## 2017-06-07 MED ORDER — ACETAMINOPHEN 325 MG PO TABS: 650.0000 mg | ORAL_TABLET | ORAL | 0 refills | Status: AC | PRN

## 2017-06-07 MED ORDER — IBUPROFEN 600 MG PO TABS: 600.0000 mg | ORAL_TABLET | Freq: Four times a day (QID) | ORAL | 0 refills | Status: AC

## 2017-06-07 NOTE — Lactation Note (Signed)
This note was copied from a baby's chart. Lactation Consultation Note:   Mother reports that she does plan to establish a good  Milk supply. She reports that she exclusively breastfed her last child. Mother reports that she has a good flow of colostrum.  Advised mother to breastfeed infant first and then supplement.  Mother has a DEBP at home. She was advised to post pump after each feeding for 15-20 mins . Advised to continue to hand express and do skin to skin frequently. Mother advised in cluster feeding. Discussed treatment and prevention of engorgement.  Mother is aware of available LC services and community support.   Patient Name: Boy Jennye MoccasinCourtney Coots ZOXWR'UToday's Date: 06/07/2017 Reason for consult: Follow-up assessment Type of Endocrine Disorder?: PCOS   Maternal Data    Feeding Feeding Type: Breast Fed Length of feed: 15 min  LATCH Score                   Interventions    Lactation Tools Discussed/Used     Consult Status Consult Status: Complete    Michel BickersKendrick, Scharlene Catalina McCoy 06/07/2017, 10:55 AM

## 2017-06-07 NOTE — Discharge Summary (Signed)
OB Discharge Summary     Patient Name: Sierra Jackson DOB: 13-Feb-1981 MRN: [id number]  Date of admission: 06/05/2017 Delivering MD: Pryor OchoaBANGA, CECILIA [facility]   Date of discharge: 06/07/2017  Admitting diagnosis: INDUCTION Intrauterine pregnancy: 2619w6d     Secondary diagnosis:  Active Problems:   Term pregnancy   SVD (spontaneous vaginal delivery)   Postpartum care following vaginal delivery  Additional problems: none     Discharge diagnosis: Term Pregnancy Delivered                                                                                                Post partum procedures:none  Augmentation: AROM and Pitocin  Complications: None  Hospital course:  Induction of Labor With Vaginal Delivery   37 y.o. yo [id number] at 4719w6d was admitted to the hospital 06/05/2017 for induction of labor.  Indication for induction: Postdates.  Patient had an uncomplicated labor course as follows: Membrane Rupture Time/Date: 5:16 PM ,06/05/2017   Intrapartum Procedures: Episiotomy: None [1]                                         Lacerations:  None [1]  Patient had delivery of a Viable infant.  Information for the patient's newborn:  Dewaine CongerBynum, Boy Megon [[id number]][[id number]]      06/05/2017  Details of delivery can be found in separate delivery note.  Patient had a routine postpartum course. Patient is discharged home 06/07/17.  Physical exam  Vitals:   06/06/17 0926 06/06/17 1804 06/06/17 1910 06/07/17 0532  BP: 125/86 (!) 135/95 128/90 116/76  Pulse: 86 70 76 67  Resp: 18 18    Temp: (!) 97.5 F (36.4 C) (!) 97.4 F (36.3 C)    TempSrc: Oral Oral    SpO2: 98% 98%    Weight:      Height:       General: alert and cooperative Lochia: appropriate Uterine Fundus: firm  Labs: Lab Results  Component Value Date   WBC 10.0 06/06/2017   HGB 11.9 (L) 06/06/2017   HCT 35.4 (L) 06/06/2017   MCV 82.9 06/06/2017   PLT 320 06/06/2017   CMP Latest Ref Rng & Units 07/07/2015  Glucose 65 - 99  mg/dL 130(Q101(H)  BUN 6 - 20 mg/dL 12  Creatinine 6.570.44 - 8.461.00 mg/dL 9.620.91  Sodium 952135 - 841145 mmol/L 141  Potassium 3.5 - 5.1 mmol/L 3.7  Chloride 101 - 111 mmol/L 108  CO2 22 - 32 mmol/L 26  Calcium 8.9 - 10.3 mg/dL 8.2(L)  Total Protein 6.5 - 8.1 g/dL 5.2(L)  Total Bilirubin 0.3 - 1.2 mg/dL 0.5  Alkaline Phos 38 - 126 U/L 70  AST 15 - 41 U/L 37  ALT 14 - 54 U/L 53    Discharge instruction: per After Visit Summary and "Baby and Me Booklet".  After visit meds:  Allergies as of 06/07/2017   No Known Allergies     Medication List    TAKE these medications   acetaminophen 325 MG  tablet Commonly known as:  TYLENOL Take 2 tablets (650 mg total) by mouth every 4 (four) hours as needed (for pain scale < 4).   CONCEPT DHA 53.5-38-1 MG Caps Take 1 capsule by mouth daily.   ibuprofen 600 MG tablet Commonly known as:  ADVIL,MOTRIN Take 1 tablet (600 mg total) by mouth every 6 (six) hours.       Diet: routine diet  Activity: Advance as tolerated. Pelvic rest for 6 weeks.   Outpatient follow up:6 weeks Follow up Appt:No future appointments. Follow up Visit:No follow-ups on file.  Postpartum contraception: Undecided  Newborn Data: Live born female  Birth Weight: 7 lb 10.4 oz (3470 g) APGAR: 8, 9  Newborn Delivery   Birth date/time:  06/05/2017 17:19:00 Delivery type:  Vaginal, Spontaneous     Baby Feeding: Breast Disposition:home with mother   06/07/2017 Oliver Pila, MD

## 2017-06-07 NOTE — Progress Notes (Signed)
Post Partum Day 2  Subjective: no complaints and up ad lib, just tired Objective: Blood pressure 116/76, pulse 67, temperature (!) 97.4 F (36.3 C), temperature source Oral, resp. rate 18, height 5\' 6"  (1.676 m), weight 269 lb (122 kg), SpO2 98 %, unknown if currently breastfeeding.  Physical Exam:  General: alert and cooperative Lochia: appropriate Uterine Fundus: firm    Recent Labs    06/05/17 1120 06/06/17 0610  HGB 12.2 11.9*  HCT 36.6 35.4*    Assessment/Plan: Discharge home  Had one BP elevated to 138/95 yesterday, but the rest WNL and no problems antepartum   LOS: 2 days   Oliver PilaKathy W Candelario Steppe 06/07/2017, 6:04 AM

## 2018-04-13 ENCOUNTER — Ambulatory Visit (HOSPITAL_COMMUNITY)
Admission: EM | Admit: 2018-04-13 | Discharge: 2018-04-13 | Disposition: A | Discharge status: Home / Self Care | Payer: BLUE CROSS/BLUE SHIELD | Attending: Family Medicine | Admitting: Family Medicine

## 2018-04-13 ENCOUNTER — Encounter (HOSPITAL_COMMUNITY): Payer: Self-pay

## 2018-04-13 ENCOUNTER — Other Ambulatory Visit: Payer: Self-pay

## 2018-04-13 DIAGNOSIS — K0889 Other specified disorders of teeth and supporting structures: Secondary | ICD-10-CM | POA: Diagnosis not present

## 2018-04-13 MED ORDER — HYDROCODONE-ACETAMINOPHEN 5-325 MG PO TABS: 1.0000 | ORAL_TABLET | Freq: Four times a day (QID) | ORAL | 0 refills | Status: DC | PRN

## 2018-04-13 MED ORDER — PENICILLIN V POTASSIUM 500 MG PO TABS: 500.0000 mg | ORAL_TABLET | Freq: Three times a day (TID) | ORAL | 0 refills | Status: DC

## 2018-04-13 NOTE — ED Triage Notes (Signed)
Pt cc dental pain x 4 days. Pt may have a abscess.

## 2018-04-13 NOTE — ED Provider Notes (Signed)
[facility]   [id number] 04/13/18 Arrival Time: 1722  ASSESSMENT & PLAN:  1. Pain, dental    No sign of abscess requiring I&D at this time. Discussed.  Meds ordered this encounter  Medications  . penicillin v potassium (VEETID) 500 MG tablet    Sig: Take 1 tablet (500 mg total) by mouth 3 (three) times daily.    Dispense:  30 tablet    Refill:  0  . HYDROcodone-acetaminophen (NORCO/VICODIN) 5-325 MG tablet    Sig: Take 1 tablet by mouth every 6 (six) hours as needed for moderate pain or severe pain.    Dispense:  8 tablet    Refill:  0    [place] Controlled Substances Registry consulted for this patient. I feel the risk/benefit ratio today is favorable for proceeding with this prescription for a controlled substance. Medication sedation precautions given.  Dental resource written instructions given. She will schedule dental evaluation as soon as possible.  Reviewed expectations re: course of current medical issues. Questions answered. Outlined signs and symptoms indicating need for more acute intervention. Patient verbalized understanding. After Visit Summary given.   SUBJECTIVE:  Sierra Jackson is a 38 y.o. female who reports gradual onset of left lower dental pain described as aching. Present for 4 days. Fever: absent. Tolerating PO intake but reports pain with chewing. Normal swallowing. She does not see a dentist regularly. No neck swelling or pain. OTC analgesics without relief.  ROS: As per HPI.  OBJECTIVE: Vitals:   04/13/18 1749  BP: 136/68  Pulse: 84  Resp: 18  Temp: 98.5 F (36.9 C)  TempSrc: Oral  SpO2: 100%  Weight: 104.3 kg    General appearance: alert; no distress HENT: normocephalic; atraumatic; dentition: fair; left lower gums without areas of fluctuance, drainage, or bleeding but with significant TTP; normal jaw movement without difficulty Neck: supple without LAD; FROM; trachea midline Lungs: normal respirations; unlabored Skin: warm  and dry Psychological: alert and cooperative; normal mood and affect  No Known Allergies  Past Medical History:  Diagnosis Date  . ADHD (attention deficit hyperactivity disorder)   . GERD (gastroesophageal reflux disease)    during pregnancy only, states was not bad enough to have to take medication  . PCOS (polycystic ovarian syndrome)   . Term pregnancy 06/05/2017  . Vaginal Pap smear, abnormal 1992, 1993   abnormal pap on two separate occasions, with biopsy done, which were negative   Social History   Socioeconomic History  . Marital status: Single    Spouse name: Not on file  . Number of children: Not on file  . Years of education: Not on file  . Highest education level: Not on file  Occupational History  . Not on file  Social Needs  . Financial resource strain: Not on file  . Food insecurity:    Worry: Not on file    Inability: Not on file  . Transportation needs:    Medical: Not on file    Non-medical: Not on file  Tobacco Use  . Smoking status: Never Smoker  . Smokeless tobacco: Never Used  Substance and Sexual Activity  . Alcohol use: No  . Drug use: No  . Sexual activity: Yes  Lifestyle  . Physical activity:    Days per week: Not on file    Minutes per session: Not on file  . Stress: Not on file  Relationships  . Social connections:    Talks on phone: Not on file    Gets  together: Not on file    Attends religious service: Not on file    Active member of club or organization: Not on file    Attends meetings of clubs or organizations: Not on file    Relationship status: Not on file  . Intimate partner violence:    Fear of current or ex partner: Not on file    Emotionally abused: Not on file    Physically abused: Not on file    Forced sexual activity: Not on file  Other Topics Concern  . Not on file  Social History Narrative  . Not on file   History reviewed. No pertinent family history. Past Surgical History:  Procedure Laterality Date  .  ADENOIDECTOMY  1985  . WISDOM TOOTH EXTRACTION       Mardella Layman, MD 04/24/18 [phone]

## 2018-04-13 NOTE — Discharge Instructions (Signed)
Be aware, pain medications may cause drowsiness. Please do not drive, operate heavy machinery or make important decisions while on this medication, it can cloud your judgement.  

## 2021-01-27 ENCOUNTER — Other Ambulatory Visit (HOSPITAL_COMMUNITY): Payer: Self-pay

## 2021-01-27 MED ORDER — AMPHETAMINE-DEXTROAMPHETAMINE 20 MG PO TABS
ORAL_TABLET | ORAL | 0 refills | Status: DC
  Filled 2021-01-27 (×2): qty 90, 30d supply, fill #0

## 2021-03-02 ENCOUNTER — Other Ambulatory Visit (HOSPITAL_COMMUNITY): Payer: Self-pay

## 2021-03-02 MED ORDER — AMPHETAMINE-DEXTROAMPHETAMINE 20 MG PO TABS
ORAL_TABLET | ORAL | 0 refills | Status: DC
  Filled 2021-03-02 – 2021-03-04 (×2): qty 90, 30d supply, fill #0

## 2021-03-04 ENCOUNTER — Other Ambulatory Visit (HOSPITAL_COMMUNITY): Payer: Self-pay

## 2021-03-21 ENCOUNTER — Other Ambulatory Visit (HOSPITAL_COMMUNITY): Payer: Self-pay

## 2021-03-21 MED ORDER — AMPHETAMINE-DEXTROAMPHETAMINE 20 MG PO TABS
ORAL_TABLET | ORAL | 0 refills | Status: AC
  Filled 2021-03-21 – 2021-04-08 (×2): qty 90, 30d supply, fill #0

## 2021-03-21 MED ORDER — AMPHETAMINE-DEXTROAMPHETAMINE 20 MG PO TABS: ORAL_TABLET | ORAL | 0 refills | Status: DC

## 2021-04-08 ENCOUNTER — Other Ambulatory Visit (HOSPITAL_COMMUNITY): Payer: Self-pay

## 2021-05-16 ENCOUNTER — Other Ambulatory Visit (HOSPITAL_COMMUNITY): Payer: Self-pay

## 2021-05-16 MED ORDER — AMPHETAMINE-DEXTROAMPHETAMINE 20 MG PO TABS
ORAL_TABLET | ORAL | 0 refills | Status: DC
  Filled 2021-07-08: qty 90, 30d supply, fill #0

## 2021-05-16 MED ORDER — AMPHETAMINE-DEXTROAMPHETAMINE 20 MG PO TABS
ORAL_TABLET | ORAL | 0 refills | Status: DC
  Filled 2021-05-16: qty 90, 30d supply, fill #0

## 2021-07-08 ENCOUNTER — Other Ambulatory Visit (HOSPITAL_COMMUNITY): Payer: Self-pay

## 2021-09-30 ENCOUNTER — Other Ambulatory Visit (HOSPITAL_COMMUNITY): Payer: Self-pay

## 2021-10-08 ENCOUNTER — Other Ambulatory Visit (HOSPITAL_COMMUNITY): Payer: Self-pay

## 2021-10-20 ENCOUNTER — Other Ambulatory Visit (HOSPITAL_COMMUNITY): Payer: Self-pay

## 2021-10-25 ENCOUNTER — Other Ambulatory Visit (HOSPITAL_COMMUNITY): Payer: Self-pay

## 2021-11-08 ENCOUNTER — Other Ambulatory Visit (HOSPITAL_COMMUNITY): Payer: Self-pay

## 2022-01-02 ENCOUNTER — Other Ambulatory Visit (HOSPITAL_COMMUNITY): Payer: Self-pay

## 2022-01-29 ENCOUNTER — Ambulatory Visit
Admission: EM | Admit: 2022-01-29 | Discharge: 2022-01-29 | Disposition: A | Discharge status: Home / Self Care | Payer: Self-pay | Attending: Emergency Medicine | Admitting: Emergency Medicine

## 2022-01-29 DIAGNOSIS — R590 Localized enlarged lymph nodes: Secondary | ICD-10-CM

## 2022-01-29 DIAGNOSIS — K047 Periapical abscess without sinus: Secondary | ICD-10-CM

## 2022-01-29 MED ORDER — FLUCONAZOLE 150 MG PO TABS: ORAL_TABLET | ORAL | 0 refills | Status: AC

## 2022-01-29 MED ORDER — AMOXICILLIN-POT CLAVULANATE 875-125 MG PO TABS: 1.0000 | ORAL_TABLET | Freq: Two times a day (BID) | ORAL | 0 refills | Status: AC

## 2022-01-29 NOTE — ED Triage Notes (Signed)
Pt states on Friday she had swollen lymph nodes and now has body aches, fever and headache.

## 2022-01-29 NOTE — ED Provider Notes (Signed)
[facility]    CSN: [id number] Arrival date & time: 01/29/22  9371    HISTORY   Chief Complaint  Patient presents with   Fever   Headache   Influenza   HPI Sierra Jackson is a pleasant, 41 y.o. female who presents to urgent care today. Patient complains of 2 days of a swollen, tender lymph node beneath her left jaw and swelling and tenderness of the left side of her neck.  Patient states she is also had headache, body ache, mild nausea and tactile fever.  Patient denies chills, cough, nasal congestion, runny nose, sinus pressure, pain with swallowing.  She reports negative home COVID test this morning patient has an elevated blood pressure on arrival today, temperature 99.1 otherwise normal vital signs.   Headache Influenza  Past Medical History:  Diagnosis Date   ADHD (attention deficit hyperactivity disorder)    GERD (gastroesophageal reflux disease)    during pregnancy only, states was not bad enough to have to take medication   PCOS (polycystic ovarian syndrome)    Term pregnancy 06/05/2017   Vaginal Pap smear, abnormal 1992, 1993   abnormal pap on two separate occasions, with biopsy done, which were negative   Patient Active Problem List   Diagnosis Date Noted   Term pregnancy 06/05/2017   SVD (spontaneous vaginal delivery) 06/05/2017   Postpartum care following vaginal delivery 06/05/2017   Postpartum endometritis 07/06/2015   Pregnancy 06/29/2015   Postpartum state 06/29/2015   Past Surgical History:  Procedure Laterality Date   ADENOIDECTOMY  1985   WISDOM TOOTH EXTRACTION     OB History     Gravida  4   Para  4   Term  4   Preterm  0   AB  0   Living  4      SAB  0   IAB  0   Ectopic  0   Multiple  0   Live Births  4          Home Medications    Prior to Admission medications   Medication Sig Start Date End Date Taking? Authorizing Provider  acetaminophen (TYLENOL) 325 MG tablet Take 2 tablets (650 mg total) by  mouth every 4 (four) hours as needed (for pain scale < 4). 06/07/17   Huel Cote, MD  amphetamine-dextroamphetamine (ADDERALL) 20 MG tablet Take 1 tablet by mouth 3 times a day 03/02/21     amphetamine-dextroamphetamine (ADDERALL) 20 MG tablet Take 1 tablet by mouth 3 times daily. 05/02/21     amphetamine-dextroamphetamine (ADDERALL) 20 MG tablet Take 1 tablet by mouth 3 times daily. 03/21/21     amphetamine-dextroamphetamine (ADDERALL) 20 MG tablet Take 1 tablet by mouth 3 times a day 05/16/21     amphetamine-dextroamphetamine (ADDERALL) 20 MG tablet Take 1 tablet by mouth 3 times a day  **06/15/2021 06/15/21     HYDROcodone-acetaminophen (NORCO/VICODIN) 5-325 MG tablet Take 1 tablet by mouth every 6 (six) hours as needed for moderate pain or severe pain. 04/13/18   Mardella Layman, MD  ibuprofen (ADVIL,MOTRIN) 600 MG tablet Take 1 tablet (600 mg total) by mouth every 6 (six) hours. 06/07/17   Huel Cote, MD  penicillin v potassium (VEETID) 500 MG tablet Take 1 tablet (500 mg total) by mouth 3 (three) times daily. 04/13/18   Mardella Layman, MD  Prenat-FeFum-FePo-FA-Omega 3 (CONCEPT DHA) 53.5-38-1 MG CAPS Take 1 capsule by mouth daily. 07/05/15   [provider]    Family History History  reviewed. No pertinent family history. Social History Social History   Tobacco Use   Smoking status: Never   Smokeless tobacco: Never  Vaping Use   Vaping Use: Never used  Substance Use Topics   Alcohol use: No   Drug use: No   Allergies   Patient has no known allergies.  Review of Systems Review of Systems Pertinent findings revealed after performing a 14 point review of systems has been noted in the history of present illness.  Physical Exam Triage Vital Signs ED Triage Vitals  Enc Vitals Group     BP 12/10/20 0827 (!) 147/82     Pulse Rate 12/10/20 0827 72     Resp 12/10/20 0827 18     Temp 12/10/20 0827 98.3 F (36.8 C)     Temp Source 12/10/20 0827 Oral     SpO2 12/10/20 0827 98  %     Weight --      Height --      Head Circumference --      Peak Flow --      Pain Score 12/10/20 0826 5     Pain Loc --      Pain Edu? --      Excl. in GC? --   No data found.  Updated Vital Signs BP (!) 152/101 (BP Location: Left Arm)   Pulse 95   Temp 99.1 F (37.3 C) (Oral)   Resp 16   LMP 12/25/2021 Comment: spotting now but has PCOS  SpO2 95%   Physical Exam Vitals and nursing note reviewed.  Constitutional:      General: She is awake. She is not in acute distress.    Appearance: Normal appearance. She is well-developed and well-groomed. She is morbidly obese. She is ill-appearing.  HENT:     Head: Normocephalic and atraumatic.     Jaw: There is normal jaw occlusion.     Salivary Glands: Right salivary gland is not diffusely enlarged or tender. Left salivary gland is diffusely enlarged and tender.     Right Ear: Hearing, ear canal and external ear normal. No drainage. No middle ear effusion. There is no impacted cerumen. Tympanic membrane is bulging. Tympanic membrane is not erythematous.     Left Ear: Hearing, ear canal and external ear normal. No drainage.  No middle ear effusion. There is no impacted cerumen. Tympanic membrane is bulging. Tympanic membrane is not erythematous.     Ears:     Comments: Bilateral TMs bulging with clear fluid    Nose: Nose normal. No nasal deformity, septal deviation, mucosal edema, congestion or rhinorrhea.     Right Turbinates: Not enlarged, swollen or pale.     Left Turbinates: Not enlarged, swollen or pale.     Right Sinus: No maxillary sinus tenderness or frontal sinus tenderness.     Left Sinus: No maxillary sinus tenderness or frontal sinus tenderness.     Mouth/Throat:     Lips: Pink. No lesions.     Mouth: Mucous membranes are moist. No oral lesions.     Dentition: Gingival swelling, dental caries and dental abscesses present.     Tongue: No lesions. Tongue does not deviate from midline.     Palate: No mass and lesions.      Pharynx: Uvula midline. Pharyngeal swelling and posterior oropharyngeal erythema present. No oropharyngeal exudate or uvula swelling.     Tonsils: No tonsillar exudate. 1+ on the right. 1+ on the left.   Eyes:  General: Lids are normal.        Right eye: No discharge.        Left eye: No discharge.     Extraocular Movements: Extraocular movements intact.     Conjunctiva/sclera: Conjunctivae normal.     Right eye: Right conjunctiva is not injected.     Left eye: Left conjunctiva is not injected.     Pupils: Pupils are equal, round, and reactive to light.  Neck:     Trachea: Trachea and phonation normal.   Cardiovascular:     Rate and Rhythm: Normal rate and regular rhythm.     Pulses: Normal pulses.     Heart sounds: Normal heart sounds. No murmur heard.    No friction rub. No gallop.  Pulmonary:     Effort: Pulmonary effort is normal. No accessory muscle usage, prolonged expiration or respiratory distress.     Breath sounds: Normal breath sounds. No stridor, decreased air movement or transmitted upper airway sounds. No decreased breath sounds, wheezing, rhonchi or rales.  Chest:     Chest wall: No tenderness.  Musculoskeletal:        General: Normal range of motion.     Cervical back: Full passive range of motion without pain, normal range of motion and neck supple. Normal range of motion.  Lymphadenopathy:     Head:     Right side of head: No submental, submandibular, tonsillar, preauricular, posterior auricular or occipital adenopathy.     Left side of head: Submandibular and posterior auricular adenopathy present. No submental, tonsillar, preauricular or occipital adenopathy.     Cervical: Cervical adenopathy present.     Right cervical: No superficial, deep or posterior cervical adenopathy.    Left cervical: Superficial cervical adenopathy and posterior cervical adenopathy present. No deep cervical adenopathy.     Upper Body:     Left upper body: Supraclavicular adenopathy  present.  Skin:    General: Skin is warm and dry.     Findings: No erythema or rash.  Neurological:     General: No focal deficit present.     Mental Status: She is alert and oriented to person, place, and time.  Psychiatric:        Mood and Affect: Mood normal.        Behavior: Behavior normal. Behavior is cooperative.     Visual Acuity Right Eye Distance:   Left Eye Distance:   Bilateral Distance:    Right Eye Near:   Left Eye Near:    Bilateral Near:     UC Couse / Diagnostics / Procedures:     Radiology No results found.  Procedures Procedures (including critical care time) EKG  Pending results:  Labs Reviewed - No data to display  Medications Ordered in UC: Medications - No data to display  UC Diagnoses / Final Clinical Impressions(s)   I have reviewed the triage vital signs and the nursing notes.  Pertinent labs & imaging results that were available during my care of the patient were reviewed by me and considered in my medical decision making (see chart for details).    Final diagnoses:  Dental abscess  Lymphadenopathy of left cervical region   Physical exam findings concerning for dental abscess.  Patient provided with a 10-day course of amoxicillin along with fluconazole for antibiotic induced vaginal yeast infection.  Patient advised to follow-up with dentist ASAP.  Return precautions advised. Please see discharge instructions below for further details of plan of care as provided to  patient. ED Prescriptions     Medication Sig Dispense Auth. Provider   amoxicillin-clavulanate (AUGMENTIN) 875-125 MG tablet Take 1 tablet by mouth 2 (two) times daily for 10 days. 20 tablet Theadora Rama Scales, PA-C   fluconazole (DIFLUCAN) 150 MG tablet Take 1 tablet on day 4 of antibiotics.  Take second tablet 3 days later. 2 tablet Theadora Rama Scales, PA-C      PDMP not reviewed this encounter.  Disposition Upon Discharge:  Condition: stable for discharge  home Home: take medications as prescribed; routine discharge instructions as discussed; follow up as advised.  Patient presented with an acute illness with associated systemic symptoms and significant discomfort requiring urgent management. In my opinion, this is a condition that a prudent lay person (someone who possesses an average knowledge of health and medicine) may potentially expect to result in complications if not addressed urgently such as respiratory distress, impairment of bodily function or dysfunction of bodily organs.   Routine symptom specific, illness specific and/or disease specific instructions were discussed with the patient and/or caregiver at length.   As such, the patient has been evaluated and assessed, work-up was performed and treatment was provided in alignment with urgent care protocols and evidence based medicine.  Patient/parent/caregiver has been advised that the patient may require follow up for further testing and treatment if the symptoms continue in spite of treatment, as clinically indicated and appropriate.  If the patient was tested for COVID-19, Influenza and/or RSV, then the patient/parent/guardian was advised to isolate at home pending the results of his/her diagnostic coronavirus test and potentially longer if they're positive. I have also advised pt that if his/her COVID-19 test returns positive, it's recommended to self-isolate for at least 10 days after symptoms first appeared AND until fever-free for 24 hours without fever reducer AND other symptoms have improved or resolved. Discussed self-isolation recommendations as well as instructions for household member/close contacts as per the [facility] and [place] [facility], and also gave patient the COVID packet with this information.  Patient/parent/caregiver has been advised to return to the [facility] or PCP in 3-5 days if no better; to PCP or the Emergency Department if new signs and symptoms develop, or if the current signs or  symptoms continue to change or worsen for further workup, evaluation and treatment as clinically indicated and appropriate  The patient will follow up with their current PCP if and as advised. If the patient does not currently have a PCP we will assist them in obtaining one.   The patient may need specialty follow up if the symptoms continue, in spite of conservative treatment and management, for further workup, evaluation, consultation and treatment as clinically indicated and appropriate.  Patient/parent/caregiver verbalized understanding and agreement of plan as discussed.  All questions were addressed during visit.  Please see discharge instructions below for further details of plan.  Discharge Instructions:   Discharge Instructions      Please read below to learn more about the medications, dosages and frequencies that I recommend to help alleviate your symptoms and to get you feeling better soon: Augmentin (amoxicillin - clavulanic acid):  take 1 tablet twice daily for 10 days, you can take it with or without food.  This antibiotic can cause upset stomach, this will resolve once antibiotics are complete.  You are welcome to use a probiotic, eat yogurt, take Imodium while taking this medication.  Please avoid other systemic medications such as Maalox, Pepto-Bismol or milk of magnesia as they can interfere with your body's  ability to absorb the antibiotics.       Diflucan (fluconazole): As you may or may not be aware, taking antibiotics can often cause patients to develop a vaginal yeast infection.  For this reason, I have provided you with a prescription for Diflucan, and antifungal medication used to treat vaginal yeast infections.  Please take the first Diflucan tablet on day 3 or 4 of your antibiotic therapy, and take the second Diflucan tablet 3 days later.  You do not need to pick up this prescription or take this medication unless you develop symptoms of vaginal yeast infection including  thick, white vaginal discharge and/or vaginal itching.  This prescription has been provided as a Research officer, political partycourtesy and for your convenience.    Advil, Motrin (ibuprofen): This is a good anti-inflammatory medication which addresses aches, pains and inflammation of the upper airways that causes sinus and nasal congestion as well as in the lower airways which makes your cough feel tight and sometimes burn.  I recommend that you take between 400 to 600 mg every 6-8 hours as needed.     Please make every effort to get a dental appointment as soon as possible to have the tooth in your left upper jaw addressed.  If you do not have the tooth evaluated and treated, the infection you are having at this time will just come back.   Please follow-up within the next 7-10 days either with your primary care provider or urgent care if your symptoms do not significantly improve.  If you do not have a primary care provider, we will assist you in finding one.        Thank you for visiting urgent care today.  We appreciate the opportunity to participate in your care.       This office note has been dictated using Teaching laboratory technicianDragon speech recognition software.  Unfortunately, this method of dictation can sometimes lead to typographical or grammatical errors.  I apologize for your inconvenience in advance if this occurs.  Please do not hesitate to reach out to me if clarification is needed.      Theadora RamaMorgan, Enslee Bibbins Scales, [place] JerseyPA-C 01/29/22 [phone]

## 2022-01-29 NOTE — Discharge Instructions (Signed)
Please read below to learn more about the medications, dosages and frequencies that I recommend to help alleviate your symptoms and to get you feeling better soon: Augmentin (amoxicillin - clavulanic acid):  take 1 tablet twice daily for 10 days, you can take it with or without food.  This antibiotic can cause upset stomach, this will resolve once antibiotics are complete.  You are welcome to use a probiotic, eat yogurt, take Imodium while taking this medication.  Please avoid other systemic medications such as Maalox, Pepto-Bismol or milk of magnesia as they can interfere with your body's ability to absorb the antibiotics.       Diflucan (fluconazole): As you may or may not be aware, taking antibiotics can often cause patients to develop a vaginal yeast infection.  For this reason, I have provided you with a prescription for Diflucan, and antifungal medication used to treat vaginal yeast infections.  Please take the first Diflucan tablet on day 3 or 4 of your antibiotic therapy, and take the second Diflucan tablet 3 days later.  You do not need to pick up this prescription or take this medication unless you develop symptoms of vaginal yeast infection including thick, white vaginal discharge and/or vaginal itching.  This prescription has been provided as a Research officer, political party and for your convenience.    Advil, Motrin (ibuprofen): This is a good anti-inflammatory medication which addresses aches, pains and inflammation of the upper airways that causes sinus and nasal congestion as well as in the lower airways which makes your cough feel tight and sometimes burn.  I recommend that you take between 400 to 600 mg every 6-8 hours as needed.     Please make every effort to get a dental appointment as soon as possible to have the tooth in your left upper jaw addressed.  If you do not have the tooth evaluated and treated, the infection you are having at this time will just come back.   Please follow-up within the next 7-10  days either with your primary care provider or urgent care if your symptoms do not significantly improve.  If you do not have a primary care provider, we will assist you in finding one.        Thank you for visiting urgent care today.  We appreciate the opportunity to participate in your care.

## 2022-07-24 ENCOUNTER — Encounter (HOSPITAL_BASED_OUTPATIENT_CLINIC_OR_DEPARTMENT_OTHER): Payer: Self-pay

## 2022-07-24 ENCOUNTER — Other Ambulatory Visit: Payer: Self-pay

## 2022-07-24 DIAGNOSIS — Z5321 Procedure and treatment not carried out due to patient leaving prior to being seen by health care provider: Secondary | ICD-10-CM | POA: Insufficient documentation

## 2022-07-24 DIAGNOSIS — X19XXXA Contact with other heat and hot substances, initial encounter: Secondary | ICD-10-CM | POA: Insufficient documentation

## 2022-07-24 DIAGNOSIS — Y929 Unspecified place or not applicable: Secondary | ICD-10-CM | POA: Insufficient documentation

## 2022-07-24 DIAGNOSIS — T23022A Burn of unspecified degree of single left finger (nail) except thumb, initial encounter: Secondary | ICD-10-CM | POA: Insufficient documentation
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
## 2022-07-25 ENCOUNTER — Emergency Department (HOSPITAL_BASED_OUTPATIENT_CLINIC_OR_DEPARTMENT_OTHER)
Admission: EM | Admit: 2022-07-25 | Discharge: 2022-07-25 | Discharge status: Against Medical Advice | Payer: Self-pay | Attending: Emergency Medicine | Admitting: Emergency Medicine

## 2022-11-23 ENCOUNTER — Other Ambulatory Visit (HOSPITAL_COMMUNITY): Payer: Self-pay

## 2022-11-23 MED ORDER — METFORMIN HCL 500 MG PO TABS
500.0000 mg | ORAL_TABLET | Freq: Every day | ORAL | 1 refills | Status: AC
  Filled 2022-11-23: qty 90, 90d supply, fill #0

## 2022-11-23 MED ORDER — HYDROCHLOROTHIAZIDE 25 MG PO TABS
12.5000 mg | ORAL_TABLET | Freq: Every day | ORAL | 1 refills | Status: AC
  Filled 2022-11-23: qty 45, 90d supply, fill #0

## 2023-01-29 ENCOUNTER — Other Ambulatory Visit (HOSPITAL_COMMUNITY): Payer: Self-pay

## 2023-01-29 MED ORDER — THYROID 30 MG PO TABS
30.0000 mg | ORAL_TABLET | Freq: Every day | ORAL | 1 refills | Status: AC
  Filled 2023-01-29: qty 30, 30d supply, fill #0
  Filled 2023-05-02: qty 30, 30d supply, fill #1

## 2023-01-29 MED ORDER — WEGOVY 0.25 MG/0.5ML ~~LOC~~ SOAJ
0.2500 mg | SUBCUTANEOUS | 0 refills | Status: DC
  Filled 2023-01-29: qty 2, 28d supply, fill #0

## 2023-01-30 ENCOUNTER — Other Ambulatory Visit (HOSPITAL_COMMUNITY): Payer: Self-pay

## 2023-01-31 ENCOUNTER — Other Ambulatory Visit (HOSPITAL_COMMUNITY): Payer: Self-pay

## 2023-02-01 ENCOUNTER — Other Ambulatory Visit (HOSPITAL_COMMUNITY): Payer: Self-pay

## 2023-02-09 ENCOUNTER — Other Ambulatory Visit (HOSPITAL_COMMUNITY): Payer: Self-pay

## 2023-02-22 ENCOUNTER — Other Ambulatory Visit (HOSPITAL_COMMUNITY): Payer: Self-pay

## 2023-02-22 MED ORDER — WEGOVY 0.25 MG/0.5ML ~~LOC~~ SOAJ
0.2500 mg | SUBCUTANEOUS | 0 refills | Status: DC
  Filled 2023-03-08: qty 2, 28d supply, fill #0

## 2023-02-22 MED ORDER — THYROID 30 MG PO TABS
30.0000 mg | ORAL_TABLET | Freq: Every day | ORAL | 0 refills | Status: AC
  Filled 2023-02-22: qty 30, 30d supply, fill #0

## 2023-02-28 ENCOUNTER — Other Ambulatory Visit (HOSPITAL_COMMUNITY): Payer: Self-pay

## 2023-03-01 ENCOUNTER — Other Ambulatory Visit (HOSPITAL_COMMUNITY): Payer: Self-pay

## 2023-03-01 MED ORDER — WEGOVY 0.5 MG/0.5ML ~~LOC~~ SOAJ
0.5000 mg | SUBCUTANEOUS | 0 refills | Status: DC
  Filled 2023-03-01 – 2023-03-09 (×2): qty 2, 28d supply, fill #0

## 2023-03-02 ENCOUNTER — Other Ambulatory Visit (HOSPITAL_COMMUNITY): Payer: Self-pay

## 2023-03-08 ENCOUNTER — Other Ambulatory Visit (HOSPITAL_COMMUNITY): Payer: Self-pay

## 2023-03-09 ENCOUNTER — Other Ambulatory Visit (HOSPITAL_COMMUNITY): Payer: Self-pay

## 2023-03-14 ENCOUNTER — Other Ambulatory Visit (HOSPITAL_COMMUNITY): Payer: Self-pay

## 2023-03-14 MED ORDER — THYROID 60 MG PO TABS
60.0000 mg | ORAL_TABLET | Freq: Every day | ORAL | 1 refills | Status: AC
  Filled 2023-03-14: qty 30, 30d supply, fill #0

## 2023-04-02 ENCOUNTER — Other Ambulatory Visit: Payer: Self-pay

## 2023-04-02 ENCOUNTER — Other Ambulatory Visit (HOSPITAL_COMMUNITY): Payer: Self-pay

## 2023-04-02 MED ORDER — WEGOVY 0.5 MG/0.5ML ~~LOC~~ SOAJ
0.5000 mg | SUBCUTANEOUS | 0 refills | Status: DC
  Filled 2023-04-02: qty 2, 28d supply, fill #0

## 2023-04-18 ENCOUNTER — Other Ambulatory Visit (HOSPITAL_COMMUNITY): Payer: Self-pay

## 2023-04-18 MED ORDER — WEGOVY 0.5 MG/0.5ML ~~LOC~~ SOAJ
0.5000 mg | SUBCUTANEOUS | 0 refills | Status: DC
  Filled 2023-04-18 – 2023-04-26 (×2): qty 2, 28d supply, fill #0

## 2023-04-19 ENCOUNTER — Other Ambulatory Visit (HOSPITAL_COMMUNITY): Payer: Self-pay

## 2023-04-26 ENCOUNTER — Other Ambulatory Visit (HOSPITAL_COMMUNITY): Payer: Self-pay

## 2023-05-02 ENCOUNTER — Other Ambulatory Visit (HOSPITAL_COMMUNITY): Payer: Self-pay

## 2023-05-03 ENCOUNTER — Other Ambulatory Visit (HOSPITAL_COMMUNITY): Payer: Self-pay

## 2023-05-03 MED ORDER — THYROID 60 MG PO TABS
60.0000 mg | ORAL_TABLET | Freq: Every day | ORAL | 1 refills | Status: AC
  Filled 2023-05-03 – 2023-05-23 (×2): qty 90, 90d supply, fill #0

## 2023-05-18 ENCOUNTER — Other Ambulatory Visit (HOSPITAL_COMMUNITY): Payer: Self-pay

## 2023-05-23 ENCOUNTER — Other Ambulatory Visit (HOSPITAL_COMMUNITY): Payer: Self-pay

## 2023-05-24 ENCOUNTER — Other Ambulatory Visit (HOSPITAL_COMMUNITY): Payer: Self-pay

## 2023-05-24 MED ORDER — WEGOVY 0.5 MG/0.5ML ~~LOC~~ SOAJ
0.5000 mg | SUBCUTANEOUS | 0 refills | Status: AC
  Filled 2023-05-24: qty 2, 28d supply, fill #0

## 2023-05-24 MED ORDER — THYROID 60 MG PO TABS
60.0000 mg | ORAL_TABLET | Freq: Every day | ORAL | 1 refills | Status: AC
  Filled 2023-05-24: qty 90, 90d supply, fill #0

## 2023-05-25 ENCOUNTER — Other Ambulatory Visit (HOSPITAL_COMMUNITY): Payer: Self-pay

## 2023-05-25 MED ORDER — LISDEXAMFETAMINE DIMESYLATE 20 MG PO CAPS
20.0000 mg | ORAL_CAPSULE | Freq: Every day | ORAL | 0 refills | Status: DC
  Filled 2023-05-25 – 2023-05-28 (×2): qty 30, 30d supply, fill #0

## 2023-05-25 MED ORDER — WEGOVY 0.5 MG/0.5ML ~~LOC~~ SOAJ
0.5000 mL | SUBCUTANEOUS | 0 refills | Status: DC
  Filled 2023-05-25: qty 3, 28d supply, fill #0

## 2023-05-25 MED ORDER — THYROID 60 MG PO TABS
60.0000 mg | ORAL_TABLET | Freq: Every day | ORAL | 1 refills | Status: AC
  Filled 2023-05-25: qty 90, 90d supply, fill #0

## 2023-05-28 ENCOUNTER — Other Ambulatory Visit (HOSPITAL_COMMUNITY): Payer: Self-pay

## 2023-05-29 ENCOUNTER — Other Ambulatory Visit (HOSPITAL_COMMUNITY): Payer: Self-pay

## 2023-06-12 ENCOUNTER — Other Ambulatory Visit (HOSPITAL_COMMUNITY): Payer: Self-pay

## 2023-06-12 MED ORDER — WEGOVY 1 MG/0.5ML ~~LOC~~ SOAJ
1.0000 mg | SUBCUTANEOUS | 0 refills | Status: DC
  Filled 2023-06-12: qty 2, 30d supply, fill #0
  Filled 2023-06-15 – 2023-06-21 (×2): qty 2, 28d supply, fill #0

## 2023-06-15 ENCOUNTER — Other Ambulatory Visit (HOSPITAL_COMMUNITY): Payer: Self-pay

## 2023-06-18 ENCOUNTER — Other Ambulatory Visit (HOSPITAL_COMMUNITY): Payer: Self-pay

## 2023-06-18 MED ORDER — METHYLPHENIDATE HCL ER (OSM) 18 MG PO TBCR
18.0000 mg | EXTENDED_RELEASE_TABLET | Freq: Every day | ORAL | 0 refills | Status: DC
  Filled 2023-06-18: qty 30, 30d supply, fill #0

## 2023-06-21 ENCOUNTER — Other Ambulatory Visit (HOSPITAL_COMMUNITY): Payer: Self-pay

## 2023-07-16 ENCOUNTER — Other Ambulatory Visit (HOSPITAL_COMMUNITY): Payer: Self-pay

## 2023-07-16 ENCOUNTER — Other Ambulatory Visit: Payer: Self-pay

## 2023-07-16 MED ORDER — WEGOVY 1 MG/0.5ML ~~LOC~~ SOAJ
1.0000 mg | SUBCUTANEOUS | 0 refills | Status: AC
  Filled 2023-07-16: qty 2, 28d supply, fill #0

## 2023-07-16 MED ORDER — SEMAGLUTIDE-WEIGHT MANAGEMENT 1 MG/0.5ML ~~LOC~~ SOAJ
1.0000 mg | SUBCUTANEOUS | 0 refills | Status: AC
  Filled 2023-07-16: qty 2, 28d supply, fill #0

## 2023-07-17 ENCOUNTER — Other Ambulatory Visit (HOSPITAL_COMMUNITY): Payer: Self-pay

## 2023-07-17 MED ORDER — METHYLPHENIDATE HCL ER (OSM) 27 MG PO TBCR
27.0000 mg | EXTENDED_RELEASE_TABLET | Freq: Every day | ORAL | 0 refills | Status: DC
  Filled 2023-07-17: qty 30, 30d supply, fill #0

## 2023-08-15 ENCOUNTER — Other Ambulatory Visit (HOSPITAL_COMMUNITY): Payer: Self-pay

## 2023-08-15 MED ORDER — WEGOVY 1 MG/0.5ML ~~LOC~~ SOAJ
1.0000 mg | SUBCUTANEOUS | 0 refills | Status: AC
  Filled 2023-08-15 – 2023-08-16 (×2): qty 2, 28d supply, fill #0

## 2023-08-15 MED ORDER — BUPROPION HCL ER (SR) 100 MG PO TB12
100.0000 mg | ORAL_TABLET | Freq: Every morning | ORAL | 0 refills | Status: DC
  Filled 2023-08-15: qty 30, 30d supply, fill #0

## 2023-08-16 ENCOUNTER — Other Ambulatory Visit (HOSPITAL_COMMUNITY): Payer: Self-pay

## 2023-08-20 ENCOUNTER — Other Ambulatory Visit (HOSPITAL_COMMUNITY): Payer: Self-pay

## 2023-08-21 ENCOUNTER — Other Ambulatory Visit (HOSPITAL_COMMUNITY): Payer: Self-pay

## 2023-09-10 ENCOUNTER — Other Ambulatory Visit (HOSPITAL_COMMUNITY): Payer: Self-pay

## 2023-09-10 MED ORDER — THYROID 60 MG PO TABS
60.0000 mg | ORAL_TABLET | Freq: Every day | ORAL | 1 refills | Status: AC
  Filled 2023-09-10: qty 90, 90d supply, fill #0

## 2023-09-10 MED ORDER — BUPROPION HCL ER (SR) 100 MG PO TB12
100.0000 mg | ORAL_TABLET | Freq: Every morning | ORAL | 0 refills | Status: AC
  Filled 2023-09-10: qty 7, 7d supply, fill #0

## 2023-09-18 ENCOUNTER — Other Ambulatory Visit (HOSPITAL_COMMUNITY): Payer: Self-pay

## 2023-09-18 MED ORDER — BUPROPION HCL ER (SR) 150 MG PO TB12
150.0000 mg | ORAL_TABLET | Freq: Every morning | ORAL | 0 refills | Status: DC
  Filled 2023-09-18: qty 30, 30d supply, fill #0

## 2023-09-18 MED ORDER — WEGOVY 1 MG/0.5ML ~~LOC~~ SOAJ
0.5000 mL | SUBCUTANEOUS | 0 refills | Status: AC
  Filled 2023-09-18: qty 2, 28d supply, fill #0

## 2023-10-17 ENCOUNTER — Other Ambulatory Visit (HOSPITAL_COMMUNITY): Payer: Self-pay

## 2023-10-17 MED ORDER — WEGOVY 1 MG/0.5ML ~~LOC~~ SOAJ
1.0000 mg | SUBCUTANEOUS | 0 refills | Status: AC
  Filled 2023-10-17: qty 2, 28d supply, fill #0

## 2023-10-19 ENCOUNTER — Other Ambulatory Visit (HOSPITAL_COMMUNITY): Payer: Self-pay

## 2023-10-23 ENCOUNTER — Other Ambulatory Visit (HOSPITAL_COMMUNITY): Payer: Self-pay

## 2023-10-25 ENCOUNTER — Other Ambulatory Visit (HOSPITAL_COMMUNITY): Payer: Self-pay

## 2023-10-26 ENCOUNTER — Other Ambulatory Visit (HOSPITAL_COMMUNITY): Payer: Self-pay

## 2023-10-26 MED ORDER — BUPROPION HCL ER (SR) 150 MG PO TB12
150.0000 mg | ORAL_TABLET | Freq: Every morning | ORAL | 0 refills | Status: DC
  Filled 2023-10-26: qty 30, 30d supply, fill #0

## 2023-11-09 ENCOUNTER — Other Ambulatory Visit (HOSPITAL_COMMUNITY): Payer: Self-pay

## 2023-11-09 MED ORDER — WEGOVY 1 MG/0.5ML ~~LOC~~ SOAJ
0.5000 mL | SUBCUTANEOUS | 0 refills | Status: AC
  Filled 2023-11-09: qty 2, 28d supply, fill #0

## 2023-11-27 ENCOUNTER — Other Ambulatory Visit (HOSPITAL_COMMUNITY): Payer: Self-pay

## 2023-11-27 MED ORDER — BUPROPION HCL ER (SR) 150 MG PO TB12
150.0000 mg | ORAL_TABLET | Freq: Every morning | ORAL | 0 refills | Status: AC
  Filled 2023-11-27: qty 30, 30d supply, fill #0

## 2024-01-14 ENCOUNTER — Other Ambulatory Visit (HOSPITAL_COMMUNITY): Payer: Self-pay

## 2024-01-14 MED ORDER — BUPROPION HCL ER (SR) 200 MG PO TB12
200.0000 mg | ORAL_TABLET | Freq: Every morning | ORAL | 0 refills | Status: DC
  Filled 2024-01-14: qty 30, 30d supply, fill #0

## 2024-01-15 ENCOUNTER — Other Ambulatory Visit: Payer: Self-pay

## 2024-01-15 ENCOUNTER — Other Ambulatory Visit (HOSPITAL_COMMUNITY): Payer: Self-pay

## 2024-01-15 MED ORDER — NP THYROID 60 MG PO TABS
60.0000 mg | ORAL_TABLET | Freq: Every day | ORAL | 0 refills | Status: AC
  Filled 2024-01-15: qty 30, 30d supply, fill #0

## 2024-01-16 ENCOUNTER — Other Ambulatory Visit (HOSPITAL_COMMUNITY): Payer: Self-pay

## 2024-01-16 MED ORDER — PHENTERMINE HCL 15 MG PO CAPS
15.0000 mg | ORAL_CAPSULE | Freq: Every day | ORAL | 0 refills | Status: AC
  Filled 2024-01-16: qty 14, 14d supply, fill #0

## 2024-02-15 ENCOUNTER — Other Ambulatory Visit (HOSPITAL_COMMUNITY): Payer: Self-pay

## 2024-02-15 ENCOUNTER — Other Ambulatory Visit: Payer: Self-pay

## 2024-02-15 MED ORDER — NP THYROID 60 MG PO TABS
60.0000 mg | ORAL_TABLET | Freq: Every day | ORAL | 0 refills | Status: AC
  Filled 2024-02-15: qty 90, 90d supply, fill #0

## 2024-02-15 MED ORDER — BUPROPION HCL ER (SR) 200 MG PO TB12
200.0000 mg | ORAL_TABLET | Freq: Every morning | ORAL | 0 refills | Status: AC
  Filled 2024-02-15: qty 30, 30d supply, fill #0

## 2024-02-18 ENCOUNTER — Other Ambulatory Visit (HOSPITAL_COMMUNITY): Payer: Self-pay

## 2024-02-18 MED ORDER — BUPROPION HCL ER (SR) 200 MG PO TB12
200.0000 mg | ORAL_TABLET | Freq: Every morning | ORAL | 0 refills | Status: AC
  Filled 2024-03-17: qty 30, 30d supply, fill #0

## 2024-02-19 ENCOUNTER — Other Ambulatory Visit (HOSPITAL_COMMUNITY): Payer: Self-pay

## 2024-02-22 ENCOUNTER — Other Ambulatory Visit: Payer: Self-pay

## 2024-02-22 ENCOUNTER — Telehealth: Payer: Self-pay | Admitting: Pharmacy Technician

## 2024-02-22 ENCOUNTER — Other Ambulatory Visit (HOSPITAL_COMMUNITY): Payer: Self-pay

## 2024-02-22 MED ORDER — WEGOVY 0.25 MG/0.5ML ~~LOC~~ SOAJ
0.5000 mL | SUBCUTANEOUS | 0 refills | Status: AC
  Filled 2024-02-22: qty 2, 28d supply, fill #0
  Filled 2024-02-26: qty 4, 56d supply, fill #0

## 2024-02-26 ENCOUNTER — Other Ambulatory Visit (HOSPITAL_COMMUNITY): Payer: Self-pay

## 2024-03-05 ENCOUNTER — Other Ambulatory Visit (HOSPITAL_COMMUNITY): Payer: Self-pay

## 2024-03-14 ENCOUNTER — Other Ambulatory Visit (HOSPITAL_COMMUNITY): Payer: Self-pay

## 2024-03-17 ENCOUNTER — Other Ambulatory Visit (HOSPITAL_COMMUNITY): Payer: Self-pay
